# Patient Record
Sex: Female | Born: 1985 | Race: Black or African American | Hispanic: No | Marital: Single | Smoking: Never smoker
Health system: Southern US, Community
[De-identification: ages and names within clinical notes are randomized; demographics above are authoritative.]

## PROBLEM LIST (undated history)

## (undated) ENCOUNTER — Inpatient Hospital Stay (HOSPITAL_COMMUNITY): Payer: Self-pay

## (undated) DIAGNOSIS — R87629 Unspecified abnormal cytological findings in specimens from vagina: Secondary | ICD-10-CM

## (undated) DIAGNOSIS — R569 Unspecified convulsions: Secondary | ICD-10-CM

## (undated) HISTORY — DX: Unspecified abnormal cytological findings in specimens from vagina: R87.629

## (undated) HISTORY — DX: Unspecified convulsions: R56.9

## (undated) HISTORY — PX: NO PAST SURGERIES: SHX2092

---
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
## 2007-01-11 ENCOUNTER — Emergency Department (HOSPITAL_COMMUNITY): Admission: EM | Admit: 2007-01-11 | Discharge: 2007-01-11 | Payer: Self-pay | Admitting: Emergency Medicine

## 2009-09-08 ENCOUNTER — Other Ambulatory Visit: Admission: RE | Admit: 2009-09-08 | Discharge: 2009-09-08 | Payer: Self-pay | Admitting: Family Medicine

## 2009-11-20 ENCOUNTER — Emergency Department (HOSPITAL_COMMUNITY): Admission: EM | Admit: 2009-11-20 | Discharge: 2009-11-20 | Payer: Self-pay | Admitting: Family Medicine

## 2010-05-03 LAB — POCT RAPID STREP A (OFFICE): Streptococcus, Group A Screen (Direct): NEGATIVE

## 2010-05-19 ENCOUNTER — Other Ambulatory Visit (HOSPITAL_COMMUNITY)
Admission: RE | Admit: 2010-05-19 | Discharge: 2010-05-19 | Disposition: A | Discharge status: Home / Self Care | Payer: Managed Care, Other (non HMO) | Source: Ambulatory Visit | Attending: Obstetrics and Gynecology | Admitting: Obstetrics and Gynecology

## 2010-05-19 DIAGNOSIS — Z113 Encounter for screening for infections with a predominantly sexual mode of transmission: Secondary | ICD-10-CM | POA: Insufficient documentation

## 2010-05-19 DIAGNOSIS — Z01419 Encounter for gynecological examination (general) (routine) without abnormal findings: Secondary | ICD-10-CM | POA: Insufficient documentation

## 2010-11-28 LAB — URINALYSIS, ROUTINE W REFLEX MICROSCOPIC
Glucose, UA: NEGATIVE
Hgb urine dipstick: NEGATIVE
Ketones, ur: 15 — AB
Nitrite: NEGATIVE
Protein, ur: 30 — AB
Specific Gravity, Urine: 1.027
Urobilinogen, UA: 1
pH: 7

## 2010-11-28 LAB — WET PREP, GENITAL: Trich, Wet Prep: NONE SEEN

## 2010-11-28 LAB — URINE MICROSCOPIC-ADD ON

## 2010-11-28 LAB — POCT PREGNANCY, URINE
Operator id: 272551
Preg Test, Ur: NEGATIVE

## 2010-11-28 LAB — RAPID STREP SCREEN (MED CTR MEBANE ONLY): Streptococcus, Group A Screen (Direct): POSITIVE — AB

## 2011-12-24 ENCOUNTER — Other Ambulatory Visit (HOSPITAL_COMMUNITY)
Admission: RE | Admit: 2011-12-24 | Discharge: 2011-12-24 | Disposition: A | Discharge status: Home / Self Care | Payer: Managed Care, Other (non HMO) | Source: Ambulatory Visit | Attending: Obstetrics and Gynecology | Admitting: Obstetrics and Gynecology

## 2011-12-24 DIAGNOSIS — Z113 Encounter for screening for infections with a predominantly sexual mode of transmission: Secondary | ICD-10-CM | POA: Insufficient documentation

## 2011-12-24 DIAGNOSIS — Z01419 Encounter for gynecological examination (general) (routine) without abnormal findings: Secondary | ICD-10-CM | POA: Insufficient documentation

## 2011-12-24 DIAGNOSIS — Z1151 Encounter for screening for human papillomavirus (HPV): Secondary | ICD-10-CM | POA: Insufficient documentation

## 2013-07-28 ENCOUNTER — Other Ambulatory Visit: Payer: Self-pay | Admitting: Pediatrics

## 2013-11-12 ENCOUNTER — Encounter: Payer: Self-pay | Admitting: *Deleted

## 2013-11-12 DIAGNOSIS — G40309 Generalized idiopathic epilepsy and epileptic syndromes, not intractable, without status epilepticus: Secondary | ICD-10-CM

## 2013-11-13 ENCOUNTER — Ambulatory Visit (INDEPENDENT_AMBULATORY_CARE_PROVIDER_SITE_OTHER): Payer: Managed Care, Other (non HMO) | Admitting: Family

## 2013-11-13 ENCOUNTER — Encounter: Payer: Self-pay | Admitting: Family

## 2013-11-13 VITALS — BP 114/70 | HR 76 | Ht 68.0 in | Wt 171.8 lb

## 2013-11-13 DIAGNOSIS — Z79899 Other long term (current) drug therapy: Secondary | ICD-10-CM | POA: Insufficient documentation

## 2013-11-13 DIAGNOSIS — G40309 Generalized idiopathic epilepsy and epileptic syndromes, not intractable, without status epilepticus: Secondary | ICD-10-CM

## 2013-11-13 MED ORDER — LAMICTAL 100 MG PO TABS: ORAL_TABLET | ORAL | Status: DC

## 2013-11-13 MED ORDER — LAMOTRIGINE 100 MG PO TABS: ORAL_TABLET | ORAL | Status: DC

## 2013-11-13 NOTE — Patient Instructions (Signed)
I have been you a blood test order to have your Lamictal level checked. I will call you when I have received the results. If I haven't called you within a week of you having the blood test, please call me.   I have sent in a refill to your pharmacy.   For your dizziness, work on increasing your water intake. That may be the reason you are having some of the dizziness. You should be drinking at least 60 oz of water per day.   To help you transition to adult neurology care, I will refer you to Dr Ellouise Newer with [facility]. She is a neurologist that specializes in Epilepsy. I will take care of all refills and any problems you may have until you see her. You will not need to see Dr Delice Lesch until your next annual visit next September.   Please call me if you have any questions or concerns.

## 2013-11-13 NOTE — Progress Notes (Signed)
Patient: Wendy Livingston MRN: [id number] Sex: female DOB: 1985-04-05  Provider: Rockwell Germany, NP Location of Care: [facility]  Note type: Routine return visit  History of Present Illness: Referral Source:  History from: patient Chief Complaint: Seizures  Wendy Livingston is a 28 y.o. with history of  generalized tonic-clonic seizures. She was last seen May 11, 2011. Her last seizure occurred in June 2003. She has been taking and tolerating Lamictal.  There are no plans to taper her off medication because she needs to be able to drive. She says that she is generally compliant but occasionally forgets a dose of Lamictal.   Today says that has occasional tension headaches that are not severe. She recently had a more severe headache with dizziness and ended up leaving work. It is not clear if the headache began first or the dizziness. She said that she was sitting at work and her vision became blurry, then she noted that she was dizzy. She waited, but did not feel better. She remembers having a headache but doesn't recall if it was present first. She said that she also felt funny. She said that the headache lasted for 2 hours, only improving after she went home and took a short nap. 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   Wendy Livingston also complains of some dizziness when moving from a seated to a standing position at work. She admits that she does not drink much water during the day.   Wendy Livingston has been otherwise generally healthy since last seen.   Review of Systems: 12 system review was remarkable for headaches and dizziness  Past Medical History  Diagnosis Date  . Seizures    Hospitalizations: No., Head Injury: No., Nervous System Infections: No., Immunizations up to date: Yes.   Past Medical History Comments: Her last EEG was in 2003. It was abnormal showing generalized spike wave discharges in the 3 per second rage,  consistent with generalized epilepsy. She had a brief 2 1/2 second electrographic seizure during the recording. Intermittent theta slowing was seen emanating from the left posterior temporal region.  Wendy Livingston but had side effects of changes in her ability to concentrate, problems with memory, dry mouth, problems sleeping. She was changed to brand Lamictal and the side effects resolved.   Surgical History History reviewed. No pertinent past surgical history.  Family History family history includes Diabetes in her maternal grandfather and mother; Heart attack in her maternal grandmother; Kidney failure in her maternal grandfather; Seizures in her other; Stroke in her paternal grandfather. Family History is otherwise negative for migraines, seizures, cognitive impairment, blindness, deafness, birth defects, chromosomal disorder, autism.  Social History History   Social History  . Marital Status: Single    Spouse Name: N/A    Number of Children: N/A  . Years of Education: N/A   Social History Main Topics  . Smoking status: Former Research scientist (life sciences)  . Smokeless tobacco: Never Used  . Alcohol Use: Yes  . Drug Use: No  . Sexual Activity: Yes   Other Topics Concern  . None   Social History Narrative  . None   Educational level: Research scientist (physical sciences) Attending:N/A Living with:  both parents  Hobbies/Interest: movies, going out and spending time with friends and family School comments:  Jahanna graduated from [employer] in 2007. She attended [employer] for two years. Wendy Livingston is currently working for [employer].  Physical Exam  BP 114/70  Pulse 76  Ht 5\' 8"  (1.727 m)  Wt 171 lb 12.8 oz (77.928 kg)  BMI 26.13 kg/m2  LMP 11/11/2013 General: well developed, well nourished young woman, seated on exam table, in no evident distress Head: head  normocephalic and atraumatic. Ears, Nose and Throat:  Oropharynx benign. Neck: supple with no carotid or supraclavicular  bruits. Respiratory: lungs clear to auscultation Cardiovascular: regular rate and rhythm, no murmurs Musculoskeletal: no deformities or scoliosis Skin: no rashes or lesions  Neurologic Exam  Mental Status: Awake and fully alert.  Oriented to place and time.  Recent and remote memory intact.  Attention span, concentration, and fund of knowledge appropriate.  Mood and affect appropriate. Cranial Nerves: Fundoscopic exam reveals sharp disc margins.  Pupils equal, briskly reactive to light.  Extraocular movements full without nystagmus.  Visual fields full to confrontation.  Hearing intact and symmetric to finger rub.  Facial sensation intact.  Face, tongue, palate move normally and symmetrically.  Neck flexion and extension normal. Motor: Normal bulk and tone.  Normal strength in all tested extremity muscles. Sensory: Intact to touch and temperature in all extremities. Coordination: Rapid alternating movements normal in all extremities.  Finger-to-nose and heel-to-shin performed accurately bilaterally. Romberg negative. Gait and Station: Arises from chair without difficulty.  Stance is normal.  Gait demonstrates normal stride length and balance.  Able to heel, toe, and tandem walk without difficulty. Reflexes: 1+ and symmetric.  Toes downgoing.  Assessment and Plan Jeremie is a 28 year old young woman with history of generalized tonic-clonic seizures. Her last seizure occurred in June 2003. She remains on medication because she needs to be able to drive. She is taking and tolerating Lamictal. She also has tension headaches, and had a recent headache that was more severe, requiring sleep to resolve. She had blurry vision, then dizziness with it, which makes me think that she had a migraine, but she also said that she felt funny. Because there is some concern for a seizure aura, I recommended that she have her Lamictal level checked. She has not taken her medication yet today, so I gave her a blood test  order to have this done. I will call her when I receive the results. She will continue her medication without change for now. I also talked with her about drinking more water as she also complained of episodes of orthostatic dizziness, in which she would stand up and feel dizziness. She admits that she does not drink much water during the day.   Finally, as Nhyira is 28 years old, I talked with her about transitioning to an adult practice. I will refer her to Dr Ellouise Newer for her follow up visit next year. I will provide refills and manage concerns that she has until she is seen by Dr Delice Lesch. Demica agreed with these plans.

## 2013-12-18 ENCOUNTER — Ambulatory Visit: Payer: Managed Care, Other (non HMO) | Admitting: Neurology

## 2013-12-21 ENCOUNTER — Telehealth: Payer: Self-pay | Admitting: Neurology

## 2013-12-21 ENCOUNTER — Encounter: Payer: Self-pay | Admitting: Neurology

## 2013-12-21 NOTE — Telephone Encounter (Signed)
Pt no showed 12/18/13 NP appt w/ Dr. Delice Lesch. Referring provider office notified via EPIC referral. No show letter mailed to pt / Sherri S.

## 2014-08-16 ENCOUNTER — Encounter: Payer: Self-pay | Admitting: Podiatry

## 2014-08-16 ENCOUNTER — Ambulatory Visit (INDEPENDENT_AMBULATORY_CARE_PROVIDER_SITE_OTHER): Payer: BLUE CROSS/BLUE SHIELD

## 2014-08-16 ENCOUNTER — Ambulatory Visit (INDEPENDENT_AMBULATORY_CARE_PROVIDER_SITE_OTHER): Payer: BLUE CROSS/BLUE SHIELD | Admitting: Podiatry

## 2014-08-16 VITALS — BP 123/75 | HR 59 | Resp 15

## 2014-08-16 DIAGNOSIS — M79673 Pain in unspecified foot: Secondary | ICD-10-CM

## 2014-08-16 DIAGNOSIS — M2040 Other hammer toe(s) (acquired), unspecified foot: Secondary | ICD-10-CM | POA: Diagnosis not present

## 2014-08-16 NOTE — Patient Instructions (Signed)
Pre-Operative Instructions  Congratulations, you have decided to take an important step to improving your quality of life.  You can be assured that the doctors of [facility] will be with you every step of the way.  1. Plan to be at the surgery center/hospital at least 1 (one) hour prior to your scheduled time unless otherwise directed by the surgical center/hospital staff.  You must have a responsible adult accompany you, remain during the surgery and drive you home.  Make sure you have directions to the surgical center/hospital and know how to get there on time. 2. For hospital based surgery you will need to obtain a history and physical form from your family physician within 1 month prior to the date of surgery- we will give you a form for you primary physician.  3. We make every effort to accommodate the date you request for surgery.  There are however, times where surgery dates or times have to be moved.  We will contact you as soon as possible if a change in schedule is required.   4. No Aspirin/Ibuprofen for one week before surgery.  If you are on aspirin, any non-steroidal anti-inflammatory medications (Mobic, Aleve, Ibuprofen) you should stop taking it 7 days prior to your surgery.  You make take Tylenol  For pain prior to surgery.  5. Medications- If you are taking daily heart and blood pressure medications, seizure, reflux, allergy, asthma, anxiety, pain or diabetes medications, make sure the surgery center/hospital is aware before the day of surgery so they may notify you which medications to take or avoid the day of surgery. 6. No food or drink after midnight the night before surgery unless directed otherwise by surgical center/hospital staff. 7. No alcoholic beverages 24 hours prior to surgery.  No smoking 24 hours prior to or 24 hours after surgery. 8. Wear loose pants or shorts- loose enough to fit over bandages, boots, and casts. 9. No slip on shoes, sneakers are best. 10. Bring  your boot with you to the surgery center/hospital.  Also bring crutches or a walker if your physician has prescribed it for you.  If you do not have this equipment, it will be provided for you after surgery. 11. If you have not been contracted by the surgery center/hospital by the day before your surgery, call to confirm the date and time of your surgery. 12. Leave-time from work may vary depending on the type of surgery you have.  Appropriate arrangements should be made prior to surgery with your employer. 13. Prescriptions will be provided immediately following surgery by your doctor.  Have these filled as soon as possible after surgery and take the medication as directed. 14. Remove nail polish on the operative foot. 15. Wash the night before surgery.  The night before surgery wash the foot and leg well with the antibacterial soap provided and water paying special attention to beneath the toenails and in between the toes.  Rinse thoroughly with water and dry well with a towel.  Perform this wash unless told not to do so by your physician.  Enclosed: 1 Ice pack (please put in freezer the night before surgery)   1 Hibiclens skin cleaner   Pre-op Instructions  If you have any questions regarding the instructions, do not hesitate to call our office.  [place]: [address] [place], [place] [address] [phone]  [place]: [address] [place], [place] [address] [phone]  [place]: [address]  [place],  [address] [phone]  Dr. Richard   Tuchman DPM, Dr. Norman Regal DPM Dr. Richard Sikora DPM, Dr. M. Todd Hyatt DPM, Dr. Kathryn Egerton DPM 

## 2014-08-16 NOTE — Progress Notes (Signed)
Subjective:     Patient ID: Wendy Livingston, female   DOB: June 16, 1985, 29 y.o.   MRN: [id number]  HPI patient states I've had painful corns on my second toes of both feet and long-term problems with the fifth toes of both feet and states that it's been ongoing and she's tried to trim them pad them and using medicated corn pads without relief and has tried wider shoes area that corns on the fifth toes of been present for years and second toes more recently Review of Systems  All other systems reviewed and are negative.      Objective:   Physical Exam  Constitutional: She is oriented to person, place, and time.  Cardiovascular: Intact distal pulses.   Musculoskeletal: Normal range of motion.  Neurological: She is oriented to person, place, and time.  Skin: Skin is warm.  Nursing note and vitals reviewed.  neurovascular status intact with muscle strength adequate range of motion within normal limits. Patient's noted to have good digital perfusion is well oriented 3 and is found to have rotation of the fifth digit bilateral with keratotic lesion on the head of the proximal phalanx and distal keratotic lesion digits to bilateral that are painful when pressed     Assessment:     Hammertoe deformity of the second and fifth toes of both feet with lesion formation    Plan:     H&P and condition reviewed with patient. At this point I discussed conservative and surgical treatments and she states she's tried numerous conservative treatments and she would prefer to have them fixed. She would like to do a consent form today so she does not have another co-pay and at this time I did allow her to read a consent form for arthroplasty digit 5 bilateral and distal digit 2 bilateral reviewing alternative treatments and complications. Patient wants procedure understanding risk and signs consent form and understands total recovery. We'll take 6 months to one year. Patient is scheduled outpatient surgery in the  next few weeks and is given all preoperative instructions and is encouraged to call with any questions

## 2014-08-25 ENCOUNTER — Other Ambulatory Visit: Payer: Self-pay | Admitting: Obstetrics and Gynecology

## 2014-08-25 ENCOUNTER — Other Ambulatory Visit (HOSPITAL_COMMUNITY)
Admission: RE | Admit: 2014-08-25 | Discharge: 2014-08-25 | Disposition: A | Discharge status: Home / Self Care | Payer: BLUE CROSS/BLUE SHIELD | Source: Ambulatory Visit | Attending: Obstetrics and Gynecology | Admitting: Obstetrics and Gynecology

## 2014-08-25 DIAGNOSIS — Z01411 Encounter for gynecological examination (general) (routine) with abnormal findings: Secondary | ICD-10-CM | POA: Diagnosis not present

## 2014-08-25 DIAGNOSIS — R87810 Cervical high risk human papillomavirus (HPV) DNA test positive: Secondary | ICD-10-CM | POA: Diagnosis present

## 2014-08-25 DIAGNOSIS — Z1151 Encounter for screening for human papillomavirus (HPV): Secondary | ICD-10-CM | POA: Insufficient documentation

## 2014-08-26 LAB — CYTOLOGY - PAP

## 2014-08-30 ENCOUNTER — Telehealth: Payer: Self-pay | Admitting: Family

## 2014-08-30 DIAGNOSIS — G40309 Generalized idiopathic epilepsy and epileptic syndromes, not intractable, without status epilepticus: Secondary | ICD-10-CM

## 2014-08-30 DIAGNOSIS — Z79899 Other long term (current) drug therapy: Secondary | ICD-10-CM

## 2014-08-30 NOTE — Telephone Encounter (Signed)
I reviewed your note and agree with this plan, thank you. 

## 2014-08-30 NOTE — Telephone Encounter (Signed)
Wendy Livingston left a message saying that an oral contraceptive pill had been prescribed for her, and she wants to know if her Lamictal dose should be adjusted. I called and talked with Wendy Livingston about her question. I explained that oral contraceptives such as Wendy Livingston can increase the clearance of Lamictal and thus create a lower and possible non-therapeutic drug level. I explained to her that since she hasn't started the [facility] yet, that we should draw a trough blood level prior to starting it, then draw it again about 7-10 days after being on the medication. Wendy Livingston agreed with this plan and will call back to let me know where she wants to go for blood draw. I will send the order after I hear from her again. Wendy Livingston can be reached at [phone]. TG

## 2014-08-30 NOTE — Telephone Encounter (Signed)
Wendy Livingston called back and said that she wants the blood test order sent to her OB/GYN office. Fax order to [phone], attention, Sharyn Lull.

## 2014-08-30 NOTE — Addendum Note (Signed)
Addended by: Joelyn Oms on: 08/30/2014 05:08 PM   Modules accepted: Orders

## 2014-09-05 LAB — LAMOTRIGINE LEVEL: LAMOTRIGINE LVL: 3.5 ug/mL — AB (ref 4.0–18.0)

## 2014-09-08 ENCOUNTER — Telehealth: Payer: Self-pay | Admitting: *Deleted

## 2014-09-08 NOTE — Telephone Encounter (Signed)
Pt states she would like to change her 09/21/2014 surgery to 12/14/2014.  Done.

## 2014-09-27 ENCOUNTER — Other Ambulatory Visit: Payer: Self-pay | Admitting: Obstetrics and Gynecology

## 2014-10-27 ENCOUNTER — Encounter: Payer: Managed Care, Other (non HMO) | Admitting: Obstetrics & Gynecology

## 2014-10-29 ENCOUNTER — Ambulatory Visit (INDEPENDENT_AMBULATORY_CARE_PROVIDER_SITE_OTHER): Payer: BLUE CROSS/BLUE SHIELD | Admitting: Obstetrics & Gynecology

## 2014-10-29 ENCOUNTER — Encounter: Payer: Self-pay | Admitting: Obstetrics & Gynecology

## 2014-10-29 VITALS — BP 139/85 | HR 93 | Temp 97.7°F | Resp 20 | Ht 68.0 in | Wt 174.5 lb

## 2014-10-29 DIAGNOSIS — N898 Other specified noninflammatory disorders of vagina: Secondary | ICD-10-CM | POA: Diagnosis not present

## 2014-10-29 NOTE — Progress Notes (Signed)
Pt states she is here for a second opinion. Her Ob/Gyn has recommended LEEP procedure but did not discuss/offer other options.  She also is having some intermittent vaginal itching and would like to be checked for BV/Yeast.

## 2014-10-29 NOTE — Patient Instructions (Signed)
Cryoablation Cryoablation is used to remove abnormal or cancerous tissue by freezing. A probe cooled with liquid nitrogen is directed beneath the skin to the growth. The growth is frozen and destroyed. During cryoablation, liquid nitrogen or argon gas flows into a needlelike applicator (cryoprobe), creating intense cold that is placed in contact with the diseased tissue. Imaging techniques such as an ultrasound, CT scans, or an MRI may be used to help guide the cryoprobes to the proper location inside the body. LET YOUR HEALTH CARE PROVIDER KNOW ABOUT:   Any allergies you have.   All medicines you are taking, including vitamins, herbs, eye drops, creams, and over-the-counter medicines.  Previous problems you or members of your family have had with the use of anesthetics.  Any blood disorders you have.  Previous surgeries you have had.  Medical conditions you have.  RISKS AND COMPLICATIONS  Generally, this is a safe procedure. However, as with any procedure, complications can occur. Possible complications include:   Infection.  Bleeding.  Tumor is not completely destroyed or the tumor comes back (recurrence). BEFORE THE PROCEDURE   You may need to have blood tests. These tests can help tell how well your kidneys and liver are working. They can also show how well your blood clots.   If you take blood thinners, ask your health care provider when you should stop taking them.   Make arrangements for someone to drive you home. Depending on the procedure, you may be able to go home the same day. However, most people stay overnight in the hospital after this procedure. Ask your health care provider what to expect.  PROCEDURE  This procedure usually takes about 1 to 3 hours.  You will lie on an exam table. You will be connected to monitors that keep track of your heart rate, blood pressure, and breathing throughout the procedure.  An IV access tube will be placed in one of your  veins. You may be given any of the following:  A medicine that makes you sleep through the procedure (general anesthetic).  A medicine to relax you (sedative).  A medicine to numb the area (local anesthetic).   For diseased tissue located deep in the body, your health care provider will use image guidance to insert one or more cryoprobes through the skin to the site of the diseased tissue. The liquid nitrogen or argon gas will then be delivered.  To cause cell death of the diseased tissue, the tissue is repeatedly frozen and thawed. Typically, two freeze-thaw cycles are used. Once the cells are destroyed, the white blood cells of the immune system will work to clear out the dead tissue. AFTER THE PROCEDURE   If you were given a sedative, you will be sleepy.  If you were put under general anesthesia, your throat may be sore after you wake up. This is caused by the breathing tube that was placed in your throat while you were asleep.  You may be allowed to go home when you are awake and able to drink fluids, or you may need to stay in the hospital overnight.  The puncture site will be sore. This is normal. The soreness will go away in 3-5 days. Some minor bruising may also be present.  You may have some mild abdominal, flank, or right shoulder pain for 24 hours. Document Released: 11/26/2012 Document Reviewed: 11/26/2012 Exit[employer] Patient Information 2015 [employer]. This information is not intended to replace advice given to you by your health care provider.   Make sure you discuss any questions you have with your health care provider. Loop Electrosurgical Excision Procedure Loop electrosurgical excision procedure (LEEP) is the removal of a portion of the lower part of the uterus (cervix). The procedure is done when there are significantly abnormal cervical cell changes. Abnormal cell changes of the cervix can lead to cancer if left in place and untreated.  The LEEP procedure itself  typically only takes a few minutes. Often, it may be done in your caregiver's office. The procedure is considered safe for those who wish to get pregnant or are trying to get pregnant. Only under rare circumstances should this procedure be done if you are pregnant. LET YOUR CAREGIVER KNOW ABOUT:  Whether you are pregnant or late for your last menstrual period.  Allergies to foods or medicines.  All the medicines you are taking includingherbs, eyedrops, and over-the-counter medicines, and creams.  Use of steroids (by mouth or creams).  Previous problems with anesthetics or numbing medicine.  Previous gynecological surgery.  History of blood clots or bleeding problems.  Any recent or current vaginal infections (herpes, sexually transmitted infections).  Other health problems. RISKS AND COMPLICATIONS  Bleeding.  Infection.  Injury to the vagina, bladder, or rectum.  Very rare obstruction of the cervical opening that causes problems during menstruation (cervical stenosis). BEFORE THE PROCEDURE  Do not take aspirin or blood thinners (anticoagulants) for 1 week before the procedure, or as told by your caregiver.  Eat a light meal before the procedure.  Ask your caregiver about changing or stopping your regular medicines.  You may be given a pain reliever 1 or 2 hours before the procedure. PROCEDURE   A tool (speculum) is placed in the vagina. This allows your caregiver to see the cervix.  An iodine stain is applied to the cervix to find the area of abnormal cells to be removed.  Medicine is injected to numb the cervix (local anesthetic).   Electricity is passed through a thin wire loop which is then used to remove (cauterize) a small segment of the affected cervix.  Light electrocautery is used to seal any small blood vessels and prevent bleeding.  A paste may be applied to the cauterized area of the cervix to help prevent bleeding.  The tissue sample is sent to the  lab. It is examined under the microscope. AFTER THE PROCEDURE  Have someone drive you home.  You may have slight to moderate cramping.  You may notice a black vaginal discharge from the paste used on the cervix to prevent bleeding. This is normal.  Watch for excessive bleeding. This requires immediate medical care.  Ask when your test results will be ready. Make sure you get your test results. Document Released: 04/28/2002 Document Revised: 04/30/2011 Document Reviewed: 07/18/2010 [facility] Patient Information 2015 [place], [place]. This information is not intended to replace advice given to you by your health care provider. Make sure you discuss any questions you have with your health care provider.

## 2014-10-29 NOTE — Progress Notes (Signed)
Patient ID: Wendy Livingston, female   DOB: 19-Oct-1985, 29 y.o.   MRN: [id number] History:  29 y.o. G0P0000 here today for 2nd opinion of management of abnormal PAP.   She reports tha tshe was told that she would need a LEEP.  She has questions about cryo.  She also c/o vaginal itching that has been present intermittently for <1 week  The following portions of the patient's history were reviewed and updated as appropriate: allergies, current medications, past family history, past medical history, past social history, past surgical history and problem list.  Review of Systems:  Pertinent items are noted in HPI.  Objective:  Physical Exam Blood pressure 139/85, pulse 93, temperature 97.7 F (36.5 C), temperature source Oral, resp. rate 20, height 5\' 8"  (1.727 m), weight 174 lb 8 oz (79.153 kg), last menstrual period 10/04/2014. Gen: NAD Abd: Soft, nontender and nondistended Pelvic: Normal appearing external genitalia; normal appearing vaginal mucosa and cervix.  Normal discharge.    Labs and Imaging surg path 09/27/2014 [facility] 11 o'clock: CIN I (Low grade lesion; mild dysplasia) 8 o'clock: low grade dysplasia 6 o'clock:  High grade lesion ECC: high grade dysplasia  Assessment & Plan:  Cervical bx review: I explained to pt the significance of an abnormal PAP and colpo as a screening test for cervical ca and explained with cervical dysplasia is.  I have reviewed with pt that with high grade dysplasia an excisional biopsy is recommended.  She also has a +ECC.   I have also explained with her that because I did not do the actual colposcopy and I ONLY have the path reports from her to review other considerations may have been accounted for.  All of her questions were answered.  Pt offered directed LEEP with removal of entire ECC   Discharge/leukorrhea: KOH and wet mount sent  84min of 22min visit spent in discussion   Nyaja Dubuque L. Harraway-Smith, M.D., Cherlynn June

## 2014-10-30 LAB — WET PREP, GENITAL
Clue Cells Wet Prep HPF POC: NONE SEEN
TRICH WET PREP: NONE SEEN
YEAST WET PREP: NONE SEEN

## 2014-12-13 ENCOUNTER — Telehealth: Payer: Self-pay | Admitting: *Deleted

## 2014-12-13 NOTE — Telephone Encounter (Signed)
I'm calling from the Dr. Mellody Drown office.  "So you got my message.  I'm going to have to hold off on my surgery."  Is there a particular reason why?  "Anesthesia told me I would have a lot of money up front and I can't afford that right now.  I'll have to make arrangements for that."  Okay, I'll let Dr. Paulla Dolly know and call the surgical center.  I called and canceled surgery at the surgical center.

## 2014-12-14 ENCOUNTER — Encounter: Payer: Self-pay | Admitting: *Deleted

## 2014-12-14 NOTE — Telephone Encounter (Signed)
Please reschedule her in the office. We can give her oral valium and someone should drive her in for surgery

## 2014-12-21 ENCOUNTER — Other Ambulatory Visit: Payer: Self-pay

## 2014-12-22 ENCOUNTER — Telehealth: Payer: Self-pay | Admitting: *Deleted

## 2014-12-22 NOTE — Telephone Encounter (Signed)
I left messages for the patient to call me back.  Dr. Paulla Dolly wants to offer doing surgery in office since cost was too high for anesthesia at [facility].

## 2014-12-24 ENCOUNTER — Other Ambulatory Visit: Payer: Self-pay

## 2014-12-30 NOTE — Telephone Encounter (Signed)
"  I'm returning your call."  Yes, I was calling on behalf of Dr. Paulla Dolly.  He stated since you are having problems with cost for anesthesia at surgical center, he can do your surgery here in the office.  "I would like that.  Let me call you back to set up a date."

## 2014-12-30 NOTE — Telephone Encounter (Signed)
I left her a message to give me a call back regarding surgery.

## 2015-02-04 ENCOUNTER — Other Ambulatory Visit: Payer: Self-pay | Admitting: Family

## 2015-02-22 ENCOUNTER — Ambulatory Visit: Payer: BLUE CROSS/BLUE SHIELD | Admitting: Family

## 2015-06-02 DIAGNOSIS — F4321 Adjustment disorder with depressed mood: Secondary | ICD-10-CM | POA: Diagnosis not present

## 2015-06-16 ENCOUNTER — Other Ambulatory Visit: Payer: Self-pay | Admitting: Obstetrics and Gynecology

## 2015-06-16 ENCOUNTER — Other Ambulatory Visit (HOSPITAL_COMMUNITY)
Admission: RE | Admit: 2015-06-16 | Discharge: 2015-06-16 | Disposition: A | Discharge status: Home / Self Care | Payer: BLUE CROSS/BLUE SHIELD | Source: Ambulatory Visit | Attending: Obstetrics and Gynecology | Admitting: Obstetrics and Gynecology

## 2015-06-16 DIAGNOSIS — Z01411 Encounter for gynecological examination (general) (routine) with abnormal findings: Secondary | ICD-10-CM | POA: Insufficient documentation

## 2015-06-16 DIAGNOSIS — N888 Other specified noninflammatory disorders of cervix uteri: Secondary | ICD-10-CM | POA: Diagnosis not present

## 2015-06-16 DIAGNOSIS — Z1151 Encounter for screening for human papillomavirus (HPV): Secondary | ICD-10-CM | POA: Diagnosis not present

## 2015-06-16 DIAGNOSIS — R87612 Low grade squamous intraepithelial lesion on cytologic smear of cervix (LGSIL): Secondary | ICD-10-CM | POA: Diagnosis not present

## 2015-06-16 DIAGNOSIS — N871 Moderate cervical dysplasia: Secondary | ICD-10-CM | POA: Diagnosis not present

## 2015-06-20 LAB — CYTOLOGY - PAP

## 2015-07-07 DIAGNOSIS — F3342 Major depressive disorder, recurrent, in full remission: Secondary | ICD-10-CM | POA: Diagnosis not present

## 2015-08-18 DIAGNOSIS — F410 Panic disorder [episodic paroxysmal anxiety] without agoraphobia: Secondary | ICD-10-CM | POA: Diagnosis not present

## 2015-08-18 DIAGNOSIS — F3342 Major depressive disorder, recurrent, in full remission: Secondary | ICD-10-CM | POA: Diagnosis not present

## 2015-08-24 ENCOUNTER — Ambulatory Visit (INDEPENDENT_AMBULATORY_CARE_PROVIDER_SITE_OTHER): Payer: BLUE CROSS/BLUE SHIELD | Admitting: Family

## 2015-08-24 ENCOUNTER — Encounter: Payer: Self-pay | Admitting: Family

## 2015-08-24 VITALS — BP 110/70 | HR 82 | Ht 68.0 in | Wt 174.2 lb

## 2015-08-24 DIAGNOSIS — G43009 Migraine without aura, not intractable, without status migrainosus: Secondary | ICD-10-CM

## 2015-08-24 DIAGNOSIS — G44219 Episodic tension-type headache, not intractable: Secondary | ICD-10-CM | POA: Diagnosis not present

## 2015-08-24 DIAGNOSIS — G40309 Generalized idiopathic epilepsy and epileptic syndromes, not intractable, without status epilepticus: Secondary | ICD-10-CM

## 2015-08-24 NOTE — Patient Instructions (Signed)
Continue taking Lamictal as you have been taking it. Let me know if you have any seizures.   Keep track of your headaches. If they become more frequent or severe, we may need to consider a migraine preventative medication. Let me know if you are having 1 severe headache per week.   Please plan to return for follow up in 1 year or sooner if needed.

## 2015-08-24 NOTE — Progress Notes (Signed)
Patient: Wendy Livingston MRN: [id number] Sex: female DOB: [birth year]  Provider: Rockwell Germany, NP Location of Care: [facility] Child Neurology  Note type: Routine return visit  History of Present Illness: Referral Source: Dr. Alessandra Grout History from: patient and [facility] chart Chief Complaint: Seizures  Wendy Livingston is a 30 y.o. woman with history of generalized tonic-clonic seizures. She was last seen November 13, 2013 and came in at my request in order for refills to be authorized. Her last seizure occurred in June 2003. She has been taking and tolerating Lamictal. There are no plans to taper her off medication because she needs to be able to drive. She says that she is generally compliant but occasionally forgets a dose of Lamictal.   [name] says that has occasional tension headaches that are not severe. She occasionally has a more severe headache that requires sleep to obtain relief. With these headaches, she complains of frontal or bitemporal pain. She recently had a headache in which she seemed to be aware of a odor "like a locker room" that preceeded the headache. She estimates that lasted about an hour. When [name] has a headache, she takes generic Excedrin Migraine and sometimes has to lie down in order for the headache to resolve. She is unsure how often these headaches occur but estimates once or twice per month.   [name] admits that she does not drink much water and that she occasionally skips meals. She says that she works from 12n-9pm and tends to go to bed late and sleep late.   [name] has been otherwise generally healthy since last seen and has no other health concerns today other than previously mentioned.  Review of Systems: Please see the HPI for neurologic and other pertinent review of systems. Otherwise, the following systems are noncontributory including constitutional, eyes, ears, nose and throat, cardiovascular, respiratory, gastrointestinal, genitourinary,  musculoskeletal, skin, endocrine, hematologic/lymph, allergic/immunologic and psychiatric.   Past Medical History  Diagnosis Date  . Seizures ([place])   . Vaginal Pap smear, abnormal    Hospitalizations: No., Head Injury: No., Nervous System Infections: No., Immunizations up to date: Yes.   Past Medical History Comments: Her last EEG was in 2003. It was abnormal showing generalized spike wave discharges in the 3 per second rage, consistent with generalized epilepsy. She had a brief 2 1/2 second electrographic seizure during the recording. Intermittent theta slowing was seen emanating from the left posterior temporal region.  Surgical History History reviewed. No pertinent past surgical history.  Family History family history includes Diabetes in her maternal grandfather and mother; Heart attack in her maternal grandmother; Kidney failure in her maternal grandfather; Seizures in her other; Stroke in her paternal grandfather. Family History is otherwise negative for migraines, seizures, cognitive impairment, blindness, deafness, birth defects, chromosomal disorder, autism.  Social History Social History   Social History  . Marital Status: Single    Spouse Name: N/A  . Number of Children: N/A  . Years of Education: N/A   Social History Main Topics  . Smoking status: Never Smoker   . Smokeless tobacco: Never Used  . Alcohol Use: 0.0 oz/week    0 Standard drinks or equivalent per week     Comment: socially  . Drug Use: No  . Sexual Activity: Yes    Birth Control/ Protection: Pill   Other Topics Concern  . None   Social History Narrative    Allergies No Known Allergies  Physical Exam BP 110/70 mmHg  Pulse 82  Ht 5\' 8"  (1.727 m)  Wt 174 lb 3.2 oz (79.017 kg)  BMI 26.49 kg/m2  LMP 08/20/2015 General: well developed, well nourished young woman, seated on exam table, in no evident distress Head: head normocephalic and atraumatic. Ears, Nose and Throat: Oropharynx  benign. Neck: supple with no carotid or supraclavicular bruits. Respiratory: lungs clear to auscultation Cardiovascular: regular rate and rhythm, no murmurs Musculoskeletal: no deformities or scoliosis Skin: no rashes or lesions  Neurologic Exam  Mental Status: Awake and fully alert. Oriented to place and time. Recent and remote memory intact. Attention span, concentration, and fund of knowledge appropriate. Mood and affect appropriate. Cranial Nerves: Fundoscopic exam reveals sharp disc margins. Pupils equal, briskly reactive to light. Extraocular movements full without nystagmus. Visual fields full to confrontation. Hearing intact and symmetric to finger rub. Facial sensation intact. Face, tongue, palate move normally and symmetrically. Neck flexion and extension normal. Motor: Normal bulk and tone. Normal strength in all tested extremity muscles. Sensory: Intact to touch and temperature in all extremities. Coordination: Rapid alternating movements normal in all extremities. Finger-to-nose and heel-to-shin performed accurately bilaterally. Romberg negative. Gait and Station: Arises from chair without difficulty. Stance is normal. Gait demonstrates normal stride length and balance. Able to heel, toe, and tandem walk without difficulty. Reflexes: 1+ and symmetric. Toes downgoing.  Impression 1. Generalized convulsive epilepsy, not intractable 2. Episodic tension headaches, not intractable 3. Migraine without aura, not intractable   Recommendations for plan of care The patient's previous [facility] records were reviewed. Velveeta has neither had nor required imaging or lab studies since the last visit. She is a 30 year old young woman with history of generalized tonic-clonic seizures. Her last seizure occurred in June 2003. She is taking and tolerating Lamictal and will remain on medication because she needs to be able to drive. Seraya also reports episodic tension headaches and a more  severe headache that is likely migraine without aura. I talked with her about headaches and migraines, and reviewed typical triggers such as skipped meals, not drinking enough water, not getting enough sleep and stress. I asked her to keep track of her headaches and to let me know if she has at least 1 severe headache per week, as she may be a candidate for preventative medication.   When Lylliana was last seen, I had planned for her to transition to adult neurology care, but that did not occur for reasons that are not clear to me. As Davetta is complaining of more severe headaches, I will follow her for the time being, but if she does well and does not need migraine preventative medication, I will make arrangements at her next visit to transition her care. I will see her back in follow up in 1 year or sooner if needed. Teeya agreed with the plans made today.   The medication list was reviewed and reconciled.  No changes were made in the prescribed medications today.  A complete medication list was provided to the patient.     Medication List       This list is accurate as of: 08/24/15 10:39 AM.  Always use your most recent med list.               diazepam 5 MG tablet  Commonly known as:  VALIUM  Take 5 mg by mouth every 6 (six) hours as needed for anxiety.     LAMICTAL 100 MG tablet  Generic drug:  lamoTRIgine  TAKE 1 TABLET BY MOUTH TWICE A  DAY     OGESTREL 0.5-50 MG-MCG tablet  Generic drug:  norgestrel-ethinyl estradiol  Take 1 tablet by mouth daily. Reported on 08/24/2015        Total time spent with the patient was 20 minutes, of which 50% or more was spent in counseling and coordination of care.   Rockwell Germany

## 2015-08-25 DIAGNOSIS — G43009 Migraine without aura, not intractable, without status migrainosus: Secondary | ICD-10-CM | POA: Insufficient documentation

## 2015-08-25 DIAGNOSIS — G44219 Episodic tension-type headache, not intractable: Secondary | ICD-10-CM | POA: Insufficient documentation

## 2015-09-21 ENCOUNTER — Other Ambulatory Visit: Payer: Self-pay | Admitting: Obstetrics and Gynecology

## 2015-09-21 ENCOUNTER — Other Ambulatory Visit (HOSPITAL_COMMUNITY)
Admission: RE | Admit: 2015-09-21 | Discharge: 2015-09-21 | Disposition: A | Discharge status: Home / Self Care | Payer: BLUE CROSS/BLUE SHIELD | Source: Ambulatory Visit | Attending: Obstetrics and Gynecology | Admitting: Obstetrics and Gynecology

## 2015-09-21 DIAGNOSIS — Z01411 Encounter for gynecological examination (general) (routine) with abnormal findings: Secondary | ICD-10-CM | POA: Diagnosis not present

## 2015-09-21 DIAGNOSIS — Z113 Encounter for screening for infections with a predominantly sexual mode of transmission: Secondary | ICD-10-CM | POA: Insufficient documentation

## 2015-09-21 DIAGNOSIS — Z1151 Encounter for screening for human papillomavirus (HPV): Secondary | ICD-10-CM | POA: Diagnosis not present

## 2015-09-21 DIAGNOSIS — Z01419 Encounter for gynecological examination (general) (routine) without abnormal findings: Secondary | ICD-10-CM | POA: Insufficient documentation

## 2015-09-23 LAB — CYTOLOGY - PAP

## 2015-10-06 ENCOUNTER — Other Ambulatory Visit: Payer: Self-pay | Admitting: Family

## 2015-10-07 NOTE — Telephone Encounter (Signed)
Faxed Rx to pharmacy as requested

## 2015-10-21 DIAGNOSIS — K0510 Chronic gingivitis, plaque induced: Secondary | ICD-10-CM | POA: Diagnosis not present

## 2015-10-21 DIAGNOSIS — K0889 Other specified disorders of teeth and supporting structures: Secondary | ICD-10-CM | POA: Diagnosis not present

## 2016-01-20 DIAGNOSIS — F39 Unspecified mood [affective] disorder: Secondary | ICD-10-CM | POA: Diagnosis not present

## 2016-01-20 DIAGNOSIS — R7309 Other abnormal glucose: Secondary | ICD-10-CM | POA: Diagnosis not present

## 2016-01-20 DIAGNOSIS — R5383 Other fatigue: Secondary | ICD-10-CM | POA: Diagnosis not present

## 2016-01-20 DIAGNOSIS — L989 Disorder of the skin and subcutaneous tissue, unspecified: Secondary | ICD-10-CM | POA: Diagnosis not present

## 2016-02-10 DIAGNOSIS — F411 Generalized anxiety disorder: Secondary | ICD-10-CM | POA: Diagnosis not present

## 2016-02-10 DIAGNOSIS — F3342 Major depressive disorder, recurrent, in full remission: Secondary | ICD-10-CM | POA: Diagnosis not present

## 2016-04-13 DIAGNOSIS — Z136 Encounter for screening for cardiovascular disorders: Secondary | ICD-10-CM | POA: Diagnosis not present

## 2016-04-13 DIAGNOSIS — Z13220 Encounter for screening for lipoid disorders: Secondary | ICD-10-CM | POA: Diagnosis not present

## 2016-04-13 DIAGNOSIS — Z6826 Body mass index (BMI) 26.0-26.9, adult: Secondary | ICD-10-CM | POA: Diagnosis not present

## 2016-04-13 DIAGNOSIS — Z713 Dietary counseling and surveillance: Secondary | ICD-10-CM | POA: Diagnosis not present

## 2016-05-21 DIAGNOSIS — Z6827 Body mass index (BMI) 27.0-27.9, adult: Secondary | ICD-10-CM | POA: Diagnosis not present

## 2016-05-21 DIAGNOSIS — N939 Abnormal uterine and vaginal bleeding, unspecified: Secondary | ICD-10-CM | POA: Diagnosis not present

## 2016-05-21 DIAGNOSIS — Z01419 Encounter for gynecological examination (general) (routine) without abnormal findings: Secondary | ICD-10-CM | POA: Diagnosis not present

## 2016-06-13 DIAGNOSIS — N913 Primary oligomenorrhea: Secondary | ICD-10-CM | POA: Diagnosis not present

## 2016-06-13 DIAGNOSIS — E282 Polycystic ovarian syndrome: Secondary | ICD-10-CM | POA: Diagnosis not present

## 2016-08-03 DIAGNOSIS — F410 Panic disorder [episodic paroxysmal anxiety] without agoraphobia: Secondary | ICD-10-CM | POA: Diagnosis not present

## 2016-08-03 DIAGNOSIS — F331 Major depressive disorder, recurrent, moderate: Secondary | ICD-10-CM | POA: Diagnosis not present

## 2016-08-03 DIAGNOSIS — F4321 Adjustment disorder with depressed mood: Secondary | ICD-10-CM | POA: Diagnosis not present

## 2016-08-30 DIAGNOSIS — F3342 Major depressive disorder, recurrent, in full remission: Secondary | ICD-10-CM | POA: Diagnosis not present

## 2016-10-02 ENCOUNTER — Telehealth: Payer: BLUE CROSS/BLUE SHIELD | Admitting: Family

## 2016-10-02 DIAGNOSIS — N390 Urinary tract infection, site not specified: Secondary | ICD-10-CM

## 2016-10-02 DIAGNOSIS — A499 Bacterial infection, unspecified: Secondary | ICD-10-CM

## 2016-10-02 MED ORDER — NITROFURANTOIN MONOHYD MACRO 100 MG PO CAPS: 100.0000 mg | ORAL_CAPSULE | Freq: Two times a day (BID) | ORAL | 0 refills | Status: DC

## 2016-10-02 NOTE — Progress Notes (Signed)

## 2016-10-08 ENCOUNTER — Other Ambulatory Visit: Payer: Self-pay | Admitting: Pediatrics

## 2016-10-08 NOTE — Telephone Encounter (Signed)
L/M informing patient that it is time for her follow up appointment. One refill sent

## 2016-10-18 DIAGNOSIS — K047 Periapical abscess without sinus: Secondary | ICD-10-CM | POA: Diagnosis not present

## 2016-11-16 ENCOUNTER — Telehealth: Payer: BLUE CROSS/BLUE SHIELD | Admitting: Family

## 2016-11-16 DIAGNOSIS — B3731 Acute candidiasis of vulva and vagina: Secondary | ICD-10-CM

## 2016-11-16 DIAGNOSIS — B373 Candidiasis of vulva and vagina: Secondary | ICD-10-CM

## 2016-11-16 MED ORDER — FLUCONAZOLE 150 MG PO TABS: 150.0000 mg | ORAL_TABLET | ORAL | 0 refills | Status: DC | PRN

## 2016-11-16 NOTE — Progress Notes (Signed)

## 2016-11-21 DIAGNOSIS — B373 Candidiasis of vulva and vagina: Secondary | ICD-10-CM | POA: Diagnosis not present

## 2016-11-21 DIAGNOSIS — N760 Acute vaginitis: Secondary | ICD-10-CM | POA: Diagnosis not present

## 2017-01-16 ENCOUNTER — Other Ambulatory Visit: Payer: Self-pay | Admitting: Pediatrics

## 2017-01-16 NOTE — Telephone Encounter (Signed)
I will refill once with a note that future refills will only occur with a visit. I sent a message to the scheduler to call [facility] to set up an appointment. TG

## 2017-01-16 NOTE — Telephone Encounter (Signed)
Wendy Livingston, this patient has not been seen since July 2017. How would you like to proceed?

## 2017-01-16 NOTE — Telephone Encounter (Signed)
Rx was faxed to the pharacy

## 2017-01-30 DIAGNOSIS — M79645 Pain in left finger(s): Secondary | ICD-10-CM | POA: Diagnosis not present

## 2017-03-14 DIAGNOSIS — F3342 Major depressive disorder, recurrent, in full remission: Secondary | ICD-10-CM | POA: Diagnosis not present

## 2017-03-14 DIAGNOSIS — F410 Panic disorder [episodic paroxysmal anxiety] without agoraphobia: Secondary | ICD-10-CM | POA: Diagnosis not present

## 2017-03-25 DIAGNOSIS — R1013 Epigastric pain: Secondary | ICD-10-CM | POA: Diagnosis not present

## 2017-03-25 DIAGNOSIS — K219 Gastro-esophageal reflux disease without esophagitis: Secondary | ICD-10-CM | POA: Diagnosis not present

## 2017-04-16 DIAGNOSIS — Z713 Dietary counseling and surveillance: Secondary | ICD-10-CM | POA: Diagnosis not present

## 2017-04-16 DIAGNOSIS — Z13220 Encounter for screening for lipoid disorders: Secondary | ICD-10-CM | POA: Diagnosis not present

## 2017-04-16 DIAGNOSIS — Z136 Encounter for screening for cardiovascular disorders: Secondary | ICD-10-CM | POA: Diagnosis not present

## 2017-04-16 DIAGNOSIS — Z6827 Body mass index (BMI) 27.0-27.9, adult: Secondary | ICD-10-CM | POA: Diagnosis not present

## 2017-04-22 DIAGNOSIS — Z113 Encounter for screening for infections with a predominantly sexual mode of transmission: Secondary | ICD-10-CM | POA: Diagnosis not present

## 2017-09-05 DIAGNOSIS — F4321 Adjustment disorder with depressed mood: Secondary | ICD-10-CM | POA: Diagnosis not present

## 2017-09-05 DIAGNOSIS — F331 Major depressive disorder, recurrent, moderate: Secondary | ICD-10-CM | POA: Diagnosis not present

## 2017-09-09 DIAGNOSIS — H04123 Dry eye syndrome of bilateral lacrimal glands: Secondary | ICD-10-CM | POA: Diagnosis not present

## 2017-09-23 ENCOUNTER — Encounter (INDEPENDENT_AMBULATORY_CARE_PROVIDER_SITE_OTHER): Payer: Self-pay | Admitting: Family

## 2017-09-23 ENCOUNTER — Ambulatory Visit (INDEPENDENT_AMBULATORY_CARE_PROVIDER_SITE_OTHER): Payer: BLUE CROSS/BLUE SHIELD | Admitting: Family

## 2017-09-23 VITALS — BP 98/70 | HR 60 | Ht 68.0 in | Wt 173.6 lb

## 2017-09-23 DIAGNOSIS — G40309 Generalized idiopathic epilepsy and epileptic syndromes, not intractable, without status epilepticus: Secondary | ICD-10-CM | POA: Diagnosis not present

## 2017-09-23 DIAGNOSIS — G44219 Episodic tension-type headache, not intractable: Secondary | ICD-10-CM

## 2017-09-23 DIAGNOSIS — Z8742 Personal history of other diseases of the female genital tract: Secondary | ICD-10-CM

## 2017-09-23 DIAGNOSIS — G43009 Migraine without aura, not intractable, without status migrainosus: Secondary | ICD-10-CM

## 2017-09-23 NOTE — Progress Notes (Signed)
Patient: Wendy Livingston MRN: [id number] Sex: female DOB: [birth year]  Provider: Rockwell Germany, NP Location of Care: [facility] Child Neurology  Note type: Routine return visit  History of Present Illness: Referral Source: Dr. Alessandra Grout History from: patient and [facility] chart Chief Complaint: Seizures  Wendy Livingston is a 32 y.o. woman with history of generalized tonic-clonic seizures. She was last seen August 24, 2015 and came in at my request in order to continue to authorize refills of Lamictal. Wendy Livingston has remained seizure free since June 2003. She will remain on medication because of her need to be able to drive.   Wendy Livingston has additional headaches but says that they have not been problematic recently. She admits that she has trouble remember to drink water and occasionally skips meals. She says that she generally gets at least 8 hours of sleep.   Wendy Livingston tells me that she has been diagnosed with PCOS since her last visit. She had questions today regarding this diagnosis and Lamictal. Wendy Livingston has been otherwise generally healthy since she was last seen and has no other health concerns  today other than previously mentioned.  Review of Systems: Please see the HPI for neurologic and other pertinent review of systems. Otherwise, all other systems were reviewed and were negative.    Past Medical History:  Diagnosis Date  . Seizures ([place])   . Vaginal Pap smear, abnormal    Hospitalizations: No., Head Injury: No., Nervous System Infections: No., Immunizations up to date: Yes.   Past Medical History Comments: Her last EEG was in 2003. It was abnormal showing generalized spike wave discharges in the 3 per second rage, consistent with generalized epilepsy. She had a brief 2 1/2 second electrographic seizure during the recording. Intermittent theta slowing was seen emanating from the left posterior temporal region.  Surgical History History reviewed. No pertinent surgical history.  Family  History family history includes Diabetes in her maternal grandfather and mother; Heart attack in her maternal grandmother; Kidney failure in her maternal grandfather; Seizures in her other; Stroke in her paternal grandfather. Family History is otherwise negative for migraines, seizures, cognitive impairment, blindness, deafness, birth defects, chromosomal disorder, autism.  Social History Social History   Socioeconomic History  . Marital status: Single    Spouse name: Not on file  . Number of children: Not on file  . Years of education: Not on file  . Highest education level: Not on file  Occupational History  . Not on file  Social Needs  . Financial resource strain: Not on file  . Food insecurity:    Worry: Not on file    Inability: Not on file  . Transportation needs:    Medical: Not on file    Non-medical: Not on file  Tobacco Use  . Smoking status: Never Smoker  . Smokeless tobacco: Never Used  Substance and Sexual Activity  . Alcohol use: Yes    Alcohol/week: 0.0 oz    Comment: socially  . Drug use: No  . Sexual activity: Yes    Birth control/protection: Pill  Lifestyle  . Physical activity:    Days per week: Not on file    Minutes per session: Not on file  . Stress: Not on file  Relationships  . Social connections:    Talks on phone: Not on file    Gets together: Not on file    Attends religious service: Not on file    Active member of club or organization: Not on file  Attends meetings of clubs or organizations: Not on file    Relationship status: Not on file  Other Topics Concern  . Not on file  Social History Narrative  . Not on file    Allergies No Known Allergies  Physical Exam BP 98/70   Pulse 60   Ht 5\' 8"  (1.727 m)   Wt 173 lb 9.6 oz (78.7 kg)   BMI 26.40 kg/m  General: Well developed, well nourished, seated, in no evident distress, black hair, brown eyes, right handed Head: Head normocephalic and atraumatic.  Oropharynx benign. Neck:  Supple with no carotid bruits Cardiovascular: Regular rate and rhythm, no murmurs Respiratory: Breath sounds clear to auscultation Musculoskeletal: No obvious deformities or scoliosis Skin: No rashes or neurocutaneous lesions  Neurologic Exam Mental Status: Awake and fully alert.  Oriented to place and time.  Recent and remote memory intact.  Attention span, concentration, and fund of knowledge appropriate.  Mood and affect appropriate. Cranial Nerves: Fundoscopic exam reveals sharp disc margins.  Pupils equal, briskly reactive to light.  Extraocular movements full without nystagmus.  Visual fields full to confrontation.  Hearing intact and symmetric to finger rub.  Facial sensation intact.  Face tongue, palate move normally and symmetrically.  Neck flexion and extension normal. Motor: Normal bulk and tone. Normal strength in all tested extremity muscles. Sensory: Intact to touch and temperature in all extremities.  Coordination: Rapid alternating movements normal in all extremities.  Finger-to-nose and heel-to shin performed accurately bilaterally.  Romberg negative. Gait and Station: Arises from chair without difficulty.  Stance is normal. Gait demonstrates normal stride length and balance.   Able to heel, toe and tandem walk without difficulty. Reflexes: 1+ and symmetric. Toes downgoing.  Impression 1.  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  Episodic tension headaches 3.  Migraine without aura 4.  History of PCOS   Recommendations for plan of care The patient's previous [facility] records were reviewed. Wendy Livingston has neither had nor required imaging or lab studies since the last visit. She is a 32 year old woman with history of generalized convulsive epilepsy, tension and migraine headaches and PCOS. She is taking and tolerating Lamictal and has remained seizure free since 2003. Wendy Livingston headaches have not been problematic recently but I reminded her of the need to get enough sleep, to  avoid skipping meals, to drink plenty of water and manage stress as these things are known to trigger headaches. Wendy Livingston had questions about PCOS and Lamictal, as well as pregnancy in epilepsy. I talked with Wendy Livingston about her concerns and answered her questions. I talked with her about planning a pregnancy so that she can be closely monitored. I told Wendy Livingston that women with epilepsy planning a pregnancy should take 5 mg/day of folic acid in the preconception period and throughout pregnancy. Finally, I talked with Wendy Livingston about transitioning to an adult neurology practice. She said that she may be moving to [place] within the next year and asked if she could transition at that time, which is reasonable. I will otherwise see her back in follow up in 1 year or sooner if needed.   The medication list was reviewed and reconciled.  No changes were made in the prescribed medications today.  A complete medication list was provided to the patient.  Allergies as of 09/23/2017   No Known Allergies     Medication List        Accurate as of 09/23/17 11:59 PM. Always use your most recent med list.  diazepam 5 MG tablet Commonly known as:  VALIUM Take 5 mg by mouth every 6 (six) hours as needed for anxiety.   fluconazole 150 MG tablet Commonly known as:  DIFLUCAN Take 1 tablet (150 mg total) by mouth every three (3) days as needed.   LAMICTAL 100 MG tablet Generic drug:  lamoTRIgine TAKE 1 TABLET BY MOUTH TWICE A DAY   nitrofurantoin (macrocrystal-monohydrate) 100 MG capsule Commonly known as:  MACROBID Take 1 capsule (100 mg total) by mouth 2 (two) times daily.   OGESTREL 0.5-50 MG-MCG tablet Generic drug:  norgestrel-ethinyl estradiol Take 1 tablet by mouth daily. Reported on 08/24/2015   omeprazole 20 MG capsule Commonly known as:  PRILOSEC TAKE ONE CAPSULE BY MOUTH EVERY DAY IF NEEDED   TAYTULLA 1-20 MG-MCG(24) Caps Generic drug:  Norethin Ace-Eth Estrad-FE TAKE 1 CAPSULE BY MOUTH EVERY DAY        Total time spent with the patient was 25 minutes, of which 50% or more was spent in counseling and coordination of care.   Rockwell Germany NP-C

## 2017-09-25 ENCOUNTER — Encounter (INDEPENDENT_AMBULATORY_CARE_PROVIDER_SITE_OTHER): Payer: Self-pay | Admitting: Family

## 2017-09-25 DIAGNOSIS — Z8742 Personal history of other diseases of the female genital tract: Secondary | ICD-10-CM | POA: Insufficient documentation

## 2017-09-25 MED ORDER — LAMICTAL 100 MG PO TABS: 100.0000 mg | ORAL_TABLET | Freq: Two times a day (BID) | ORAL | 5 refills | Status: DC

## 2017-09-25 NOTE — Patient Instructions (Signed)
Thank you for coming in today.   Instructions for you until your next appointment are as follows: 1. Continue taking Lamictal as you have been doing. Try not to miss any doses.  2. Let me know if you have any seizures 3. Women planning pregnancy should take folic acid supplements daily before conception occurs. 4. Please plan to return for follow up in 1 year or sooner if needed.  5. Please sign up for MyChart if you have not done so

## 2017-10-05 DIAGNOSIS — F4322 Adjustment disorder with anxiety: Secondary | ICD-10-CM | POA: Diagnosis not present

## 2017-10-24 DIAGNOSIS — L700 Acne vulgaris: Secondary | ICD-10-CM | POA: Diagnosis not present

## 2017-11-19 ENCOUNTER — Other Ambulatory Visit (INDEPENDENT_AMBULATORY_CARE_PROVIDER_SITE_OTHER): Payer: Self-pay | Admitting: Family

## 2017-12-12 DIAGNOSIS — K047 Periapical abscess without sinus: Secondary | ICD-10-CM | POA: Diagnosis not present

## 2017-12-12 DIAGNOSIS — H6980 Other specified disorders of Eustachian tube, unspecified ear: Secondary | ICD-10-CM | POA: Diagnosis not present

## 2018-01-02 DIAGNOSIS — R110 Nausea: Secondary | ICD-10-CM | POA: Diagnosis not present

## 2018-01-02 DIAGNOSIS — R109 Unspecified abdominal pain: Secondary | ICD-10-CM | POA: Diagnosis not present

## 2018-01-03 ENCOUNTER — Other Ambulatory Visit: Payer: Self-pay | Admitting: Family Medicine

## 2018-01-03 DIAGNOSIS — K8000 Calculus of gallbladder with acute cholecystitis without obstruction: Secondary | ICD-10-CM

## 2018-01-03 DIAGNOSIS — K769 Liver disease, unspecified: Secondary | ICD-10-CM

## 2018-01-03 DIAGNOSIS — R1011 Right upper quadrant pain: Secondary | ICD-10-CM

## 2018-01-06 ENCOUNTER — Other Ambulatory Visit: Payer: BLUE CROSS/BLUE SHIELD

## 2018-01-07 ENCOUNTER — Ambulatory Visit
Admission: RE | Admit: 2018-01-07 | Discharge: 2018-01-07 | Disposition: A | Discharge status: Home / Self Care | Payer: BLUE CROSS/BLUE SHIELD | Source: Ambulatory Visit | Attending: Family Medicine | Admitting: Family Medicine

## 2018-01-07 DIAGNOSIS — R1011 Right upper quadrant pain: Secondary | ICD-10-CM | POA: Diagnosis not present

## 2018-01-07 DIAGNOSIS — K8000 Calculus of gallbladder with acute cholecystitis without obstruction: Secondary | ICD-10-CM

## 2018-01-07 DIAGNOSIS — K769 Liver disease, unspecified: Secondary | ICD-10-CM

## 2018-01-27 ENCOUNTER — Telehealth (INDEPENDENT_AMBULATORY_CARE_PROVIDER_SITE_OTHER): Payer: Self-pay | Admitting: Family

## 2018-01-27 DIAGNOSIS — G40309 Generalized idiopathic epilepsy and epileptic syndromes, not intractable, without status epilepticus: Secondary | ICD-10-CM

## 2018-01-27 NOTE — Telephone Encounter (Signed)
Patiet's insurance needs to be updated. It is not active

## 2018-01-27 NOTE — Telephone Encounter (Signed)
°  Who's calling (name and relationship to patient) : Threasa Beards ([facility])  Best contact number: [phone] Provider they see: Otila Kluver  Reason for call: Threasa Beards called and stated that a prior auth will be needed for brand name Lamictal.

## 2018-01-28 DIAGNOSIS — J069 Acute upper respiratory infection, unspecified: Secondary | ICD-10-CM | POA: Diagnosis not present

## 2018-01-28 MED ORDER — LAMOTRIGINE 100 MG PO TABS: ORAL_TABLET | ORAL | 5 refills | Status: DC

## 2018-01-28 NOTE — Telephone Encounter (Signed)
I called the pharmacy and obtained the insurance information, put in PA request through Covermymeds. Her pharmacy insurance is [facility] [employer] ID# [id number]. I will follow up later to see if it has been approved. TG

## 2018-01-28 NOTE — Telephone Encounter (Signed)
I called Wendy Livingston and let her know that her insurance denied the request for coverage for brand Lamictal. I sent in a prescription for generic Lamotrigine and asked her to let me know how she does with that. Maimuna agreed with this plan. TG

## 2018-01-30 DIAGNOSIS — R1011 Right upper quadrant pain: Secondary | ICD-10-CM | POA: Diagnosis not present

## 2018-01-30 DIAGNOSIS — K5900 Constipation, unspecified: Secondary | ICD-10-CM | POA: Diagnosis not present

## 2018-03-17 DIAGNOSIS — K5900 Constipation, unspecified: Secondary | ICD-10-CM | POA: Diagnosis not present

## 2018-04-11 DIAGNOSIS — F4322 Adjustment disorder with anxiety: Secondary | ICD-10-CM | POA: Diagnosis not present

## 2018-04-11 DIAGNOSIS — F451 Undifferentiated somatoform disorder: Secondary | ICD-10-CM | POA: Diagnosis not present

## 2018-04-11 DIAGNOSIS — F3342 Major depressive disorder, recurrent, in full remission: Secondary | ICD-10-CM | POA: Diagnosis not present

## 2018-04-14 DIAGNOSIS — Z3201 Encounter for pregnancy test, result positive: Secondary | ICD-10-CM | POA: Diagnosis not present

## 2018-04-14 DIAGNOSIS — Z713 Dietary counseling and surveillance: Secondary | ICD-10-CM | POA: Diagnosis not present

## 2018-04-14 DIAGNOSIS — Z131 Encounter for screening for diabetes mellitus: Secondary | ICD-10-CM | POA: Diagnosis not present

## 2018-04-14 DIAGNOSIS — Z136 Encounter for screening for cardiovascular disorders: Secondary | ICD-10-CM | POA: Diagnosis not present

## 2018-04-14 DIAGNOSIS — Z13220 Encounter for screening for lipoid disorders: Secondary | ICD-10-CM | POA: Diagnosis not present

## 2018-04-17 ENCOUNTER — Inpatient Hospital Stay (HOSPITAL_COMMUNITY): Payer: BLUE CROSS/BLUE SHIELD

## 2018-04-17 ENCOUNTER — Encounter (HOSPITAL_COMMUNITY): Payer: Self-pay | Admitting: *Deleted

## 2018-04-17 ENCOUNTER — Inpatient Hospital Stay (HOSPITAL_COMMUNITY)
Admission: AD | Admit: 2018-04-17 | Discharge: 2018-04-17 | Disposition: A | Discharge status: Home / Self Care | Payer: BLUE CROSS/BLUE SHIELD | Attending: Obstetrics and Gynecology | Admitting: Obstetrics and Gynecology

## 2018-04-17 DIAGNOSIS — O360110 Maternal care for anti-D [Rh] antibodies, first trimester, not applicable or unspecified: Secondary | ICD-10-CM | POA: Diagnosis not present

## 2018-04-17 DIAGNOSIS — Z3A09 9 weeks gestation of pregnancy: Secondary | ICD-10-CM | POA: Diagnosis not present

## 2018-04-17 DIAGNOSIS — Z3A10 10 weeks gestation of pregnancy: Secondary | ICD-10-CM | POA: Diagnosis not present

## 2018-04-17 DIAGNOSIS — O209 Hemorrhage in early pregnancy, unspecified: Secondary | ICD-10-CM | POA: Diagnosis not present

## 2018-04-17 DIAGNOSIS — O26891 Other specified pregnancy related conditions, first trimester: Secondary | ICD-10-CM | POA: Diagnosis not present

## 2018-04-17 DIAGNOSIS — Z79899 Other long term (current) drug therapy: Secondary | ICD-10-CM | POA: Insufficient documentation

## 2018-04-17 DIAGNOSIS — Z3491 Encounter for supervision of normal pregnancy, unspecified, first trimester: Secondary | ICD-10-CM

## 2018-04-17 DIAGNOSIS — Z6791 Unspecified blood type, Rh negative: Secondary | ICD-10-CM

## 2018-04-17 LAB — CBC
HCT: 38.6 % (ref 36.0–46.0)
HEMOGLOBIN: 12.8 g/dL (ref 12.0–15.0)
MCH: 27 pg (ref 26.0–34.0)
MCHC: 33.2 g/dL (ref 30.0–36.0)
MCV: 81.4 fL (ref 80.0–100.0)
Platelets: 312 10*3/uL (ref 150–400)
RBC: 4.74 MIL/uL (ref 3.87–5.11)
RDW: 12.4 % (ref 11.5–15.5)
WBC: 9.5 10*3/uL (ref 4.0–10.5)
nRBC: 0 % (ref 0.0–0.2)

## 2018-04-17 LAB — URINALYSIS, ROUTINE W REFLEX MICROSCOPIC
Bilirubin Urine: NEGATIVE
Glucose, UA: NEGATIVE mg/dL
KETONES UR: 5 mg/dL — AB
Leukocytes,Ua: NEGATIVE
Nitrite: NEGATIVE
PROTEIN: 30 mg/dL — AB
Specific Gravity, Urine: 1.028 (ref 1.005–1.030)
pH: 7 (ref 5.0–8.0)

## 2018-04-17 LAB — POCT PREGNANCY, URINE: Preg Test, Ur: POSITIVE — AB

## 2018-04-17 LAB — HCG, QUANTITATIVE, PREGNANCY: hCG, Beta Chain, Quant, S: 179130 m[IU]/mL — ABNORMAL HIGH (ref <5)

## 2018-04-17 LAB — ABO/RH: ABO/RH(D): AB NEG

## 2018-04-17 MED ORDER — RHO D IMMUNE GLOBULIN 1500 UNIT/2ML IJ SOSY
300.0000 ug | PREFILLED_SYRINGE | Freq: Once | INTRAMUSCULAR | Status: DC
  Filled 2018-04-17: qty 2

## 2018-04-17 NOTE — Discharge Instructions (Signed)
Rh Incompatibility Rh incompatibility is a condition that occurs during pregnancy if a woman has Rh-negative blood and her baby has Rh-positive blood. "Rh-negative" and "Rh-positive" refer to whether or not the blood has a specific protein found on the surface of red blood cells (Rh factor). If a woman has Rh factor, she is Rh-positive. If she does not have an Rh factor, she is Rh-negative. Having or not having an Rh factor does not affect the mothers general health. However, it can cause problems during pregnancy. What kind of problems can Rh incompatibility cause? During pregnancy, blood from the baby can cross into the mothers bloodstream, especially during delivery. If a mother is Rh-negative and the baby is Rh-positive, the mothers defense (immune) system will react to the baby's blood as if it were a foreign substance and will create certain proteins (antibodies) in response to it. This process is called sensitization. Once the mother is sensitized, her Rh antibodies will cross the placenta in future pregnancies and attack the babys Rh-positive blood as if it were a harmful substance. Rh incompatibility can also happen if a pregnant woman who is Rh-negative receives a donation (transfusion) of Rh-positive blood. How does this condition affect my baby? The Rh antibodies that attack and destroy your babys red blood cells can lead to hemolytic disease in the baby. This is a condition in which red blood cells break down. This can cause:  Yellowing of the skin and the whites of the eyes (jaundice).  Fewer red blood cells in the body (anemia).  Brain damage.  Heart failure.  Death. These antibodies usually do not cause problems during a woman's first pregnancy. This is because blood from the baby often crosses into the mothers bloodstream during delivery, and the baby is born before many of the antibodies can develop. However, once antibodies have formed, they stay in a woman's body. Because  of this, Rh incompatibility is more likely to cause problems in second or later pregnancies (if the baby is Rh-positive). How is this diagnosed?  When you become pregnant, you may have blood tests to determine your blood type and Rh factor. If you are Rh-negative, you may have another blood test called an antibody screen. The antibody screen shows whether you have Rh antibodies in your blood. If you do, it may mean that you have been exposed to Rh-positive blood before, and that you have a risk for Rh incompatibility. To find out whether your baby is developing hemolytic anemia and how serious it is, health care providers may use more advanced tests, such as ultrasound. How is Rh incompatibility treated? This condition is treated with two shots (injections) of medicine called Rho (D) immune globulin. This medicine keeps your body from making antibodies that can cause serious problems for your baby or for future babies. You will get one shot around your seventh month of pregnancy and the other within 72 hours of your baby's birth. If you are Rh-negative, you will need this medicine every time you have a baby with Rh-positive blood. If you are Rh-negative and there is a high risk of blood transfer between you and your baby, you may be given Rho (D) immune globulin. The risk of blood transfer is high if you experience:  Amniocentesis. This is a procedure to remove a small amount of the fluid that surrounds a baby in the uterus (amniotic fluid) so that it can be tested.  A miscarriage or an abortion.  An ectopic pregnancy. This is a pregnancy in  which the fertilized egg attaches (implants) outside the uterus.  Any vaginal bleeding during pregnancy. If you already have antibodies in your blood, your pregnancy will be closely monitored. You will not be given Rho (D) immune globulin because it is not effective in these cases. Summary  Rh incompatibility is a condition that occurs during pregnancy if a  woman has Rh-negative blood and her baby has Rh-positive blood.  If a mother is Rh-negative and the baby is Rh-positive, the mothers immune system will react to the baby's blood as if it were a foreign substance, and will create antibodies.  Rh antibodies that attack and destroy the babys red blood cells can lead to hemolytic disease in the baby.  This condition is treated with a shot of medicine called Rho (D) immune globulin. This medicine keeps the woman's body from making antibodies that can cause serious problems in the baby or future babies. This information is not intended to replace advice given to you by your health care provider. Make sure you discuss any questions you have with your health care provider. Document Released: 07/28/2001 Document Revised: 04/03/2016 Document Reviewed: 04/03/2016 Elsevier Interactive Patient Education  2019 [employer].

## 2018-04-17 NOTE — MAU Provider Note (Signed)
Chief Complaint: Miscarriage   First Provider Initiated Contact with Patient 04/17/18 1509     SUBJECTIVE HPI: Wendy Livingston is a 33 y.o. G1P0000 at [redacted]w[redacted]d who presents to Maternity Admissions reporting vaginal bleeding & low back pain. Had pregnancy confirmed at [facility] with UPT only.  Has not scheduled prenatal care yet.  Has used Dr. Ronita Hipps in the past & plans on using him for prenatal care but hasn't scheduled appt yet.   Reports heavy vaginal bleeding last night & passed a large clot. Bleeding has not continued & has not had bleeding today. Does reports intermittent lower back pain & some intermittent lower abdominal pain that feels like "gas".   Location: back Quality: aching Severity: 3/10 on pain scale Duration: 1 day Timing: intermittent Modifying factors: none Associated signs and symptoms: vaginal bleeding  Past Medical History:  Diagnosis Date  . Seizures ([place])   . Vaginal Pap smear, abnormal    OB History  Gravida Para Term Preterm AB Living  1 0 0 0 0 0  SAB TAB Ectopic Multiple Live Births  0 0 0 0      # Outcome Date GA Lbr Len/2nd Weight Sex Delivery Anes PTL Lv  1 Current            Past Surgical History:  Procedure Laterality Date  . NO PAST SURGERIES     Social History   Socioeconomic History  . Marital status: Single    Spouse name: Not on file  . Number of children: Not on file  . Years of education: Not on file  . Highest education level: Not on file  Occupational History  . Not on file  Social Needs  . Financial resource strain: Not on file  . Food insecurity:    Worry: Not on file    Inability: Not on file  . Transportation needs:    Medical: Not on file    Non-medical: Not on file  Tobacco Use  . Smoking status: Never Smoker  . Smokeless tobacco: Never Used  Substance and Sexual Activity  . Alcohol use: Yes    Alcohol/week: 0.0 standard drinks    Comment: socially  . Drug use: No  . Sexual activity: Yes  Lifestyle   . Physical activity:    Days per week: Not on file    Minutes per session: Not on file  . Stress: Not on file  Relationships  . Social connections:    Talks on phone: Not on file    Gets together: Not on file    Attends religious service: Not on file    Active member of club or organization: Not on file    Attends meetings of clubs or organizations: Not on file    Relationship status: Not on file  . Intimate partner violence:    Fear of current or ex partner: Not on file    Emotionally abused: Not on file    Physically abused: Not on file    Forced sexual activity: Not on file  Other Topics Concern  . Not on file  Social History Narrative  . Not on file   Family History  Problem Relation Age of Onset  . Diabetes Mother   . Seizures Other   . Heart attack Maternal Grandmother   . Diabetes Maternal Grandfather   . Kidney failure Maternal Grandfather   . Stroke Paternal Grandfather    No current facility-administered medications on file prior to encounter.    Current Outpatient  Medications on File Prior to Encounter  Medication Sig Dispense Refill  . lamoTRIgine (LAMICTAL) 100 MG tablet Take 1 tablet twice per day 60 tablet 5  . omeprazole (PRILOSEC) 20 MG capsule TAKE ONE CAPSULE BY MOUTH EVERY DAY IF NEEDED  1   No Known Allergies  I have reviewed patient's Past Medical Hx, Surgical Hx, Family Hx, Social Hx, medications and allergies.   Review of Systems  Constitutional: Negative.   Gastrointestinal: Positive for abdominal pain. Negative for constipation, diarrhea, nausea and vomiting.  Genitourinary: Positive for vaginal bleeding (not currently). Negative for dysuria and vaginal discharge.  Musculoskeletal: Positive for back pain.    OBJECTIVE Patient Vitals for the past 24 hrs:  BP Temp Pulse Resp Height Weight  04/17/18 1649 126/64 - 64 - - -  04/17/18 1340 133/67 99.1 F (37.3 C) 68 18 5\' 9"  (1.753 m) 73.9 kg   Constitutional: Well-developed,  well-nourished female in no acute distress.  Cardiovascular: normal rate & rhythm, no murmur Respiratory: normal rate and effort. Lung sounds clear throughout GI: Abd soft, non-tender, Pos BS x 4. No guarding or rebound tenderness MS: Extremities nontender, no edema, normal ROM Neurologic: Alert and oriented x 4.    LAB RESULTS Results for orders placed or performed during the hospital encounter of 04/17/18 (from the past 24 hour(s))  Pregnancy, urine POC     Status: Abnormal   Collection Time: 04/17/18  1:47 PM  Result Value Ref Range   Preg Test, Ur POSITIVE (A) NEGATIVE  Urinalysis, Routine w reflex microscopic     Status: Abnormal   Collection Time: 04/17/18  1:49 PM  Result Value Ref Range   Color, Urine YELLOW YELLOW   APPearance CLEAR CLEAR   Specific Gravity, Urine 1.028 1.005 - 1.030   pH 7.0 5.0 - 8.0   Glucose, UA NEGATIVE NEGATIVE mg/dL   Hgb urine dipstick MODERATE (A) NEGATIVE   Bilirubin Urine NEGATIVE NEGATIVE   Ketones, ur 5 (A) NEGATIVE mg/dL   Protein, ur 30 (A) NEGATIVE mg/dL   Nitrite NEGATIVE NEGATIVE   Leukocytes,Ua NEGATIVE NEGATIVE   RBC / HPF 0-5 0 - 5 RBC/hpf   WBC, UA 0-5 0 - 5 WBC/hpf   Bacteria, UA RARE (A) NONE SEEN   Squamous Epithelial / LPF 6-10 0 - 5   Mucus PRESENT   CBC     Status: None   Collection Time: 04/17/18  3:33 PM  Result Value Ref Range   WBC 9.5 4.0 - 10.5 K/uL   RBC 4.74 3.87 - 5.11 MIL/uL   Hemoglobin 12.8 12.0 - 15.0 g/dL   HCT 38.6 36.0 - 46.0 %   MCV 81.4 80.0 - 100.0 fL   MCH 27.0 26.0 - 34.0 pg   MCHC 33.2 30.0 - 36.0 g/dL   RDW 12.4 11.5 - 15.5 %   Platelets 312 150 - 400 K/uL   nRBC 0.0 0.0 - 0.2 %  ABO/Rh     Status: None   Collection Time: 04/17/18  3:33 PM  Result Value Ref Range   ABO/RH(D)      AB NEG Performed at [facility], [address] [place], [place] [address]   hCG, quantitative, pregnancy     Status: Abnormal   Collection Time: 04/17/18  3:33 PM  Result Value Ref Range   hCG, Beta  Chain, Quant, S 179,130 (H) <5 mIU/mL    IMAGING No results found.  MAU COURSE Orders Placed This Encounter  Procedures  . US  OB Comp Less 14 Wks  . Urinalysis, Routine w reflex microscopic  . CBC  . hCG, quantitative, pregnancy  . Pregnancy, urine POC  . ABO/Rh  . Discharge patient   Meds ordered this encounter  Medications  . DISCONTD: rho (d) immune globulin (RHIG/RHOPHYLAC) injection 300 mcg    MDM +UPT UA, wet prep, GC/chlamydia, CBC, ABO/Rh, quant hCG, and [place] today to rule out ectopic pregnancy  Ultrasound shows live IUP  Rh negative. Discussed recommendation of rhogam to prevent alloimmunization. Patient declines rhogam today. States she wants to think about it. Discussed recommendation of administration within 72 hrs of bleeding episode. Pt states she will follow up with her doctor or return to MAU.   ASSESSMENT 1. Normal IUP (intrauterine pregnancy) on prenatal ultrasound, first trimester   2. Vaginal bleeding in pregnancy, first trimester   3. Rh negative state in antepartum period, first trimester     PLAN Discharge home in stable condition. Bleeding precautions Pt refused rhogam today -- encouraged to f/u within the next 2 days for administration Start prenatal care  Follow-up Information    Brien Few, MD. Schedule an appointment as soon as possible for a visit.   Specialty:  Obstetrics and Gynecology Contact information: [place] [address] [phone]          Allergies as of 04/17/2018   No Known Allergies     Medication List    STOP taking these medications   diazepam 5 MG tablet Commonly known as:  VALIUM   fluconazole 150 MG tablet Commonly known as:  DIFLUCAN   nitrofurantoin (macrocrystal-monohydrate) 100 MG capsule Commonly known as:  MACROBID   OGESTREL 0.5-50 MG-MCG tablet Generic drug:  norgestrel-ethinyl estradiol   TAYTULLA 1-20 MG-MCG(24) Caps Generic drug:  Norethin Ace-Eth Estrad-FE      TAKE these medications   lamoTRIgine 100 MG tablet Commonly known as:  LAMICTAL Take 1 tablet twice per day   omeprazole 20 MG capsule Commonly known as:  PRILOSEC TAKE ONE CAPSULE BY MOUTH EVERY DAY IF NEEDED        Jorje Guild, NP 04/17/2018  5:52 PM

## 2018-04-17 NOTE — MAU Note (Signed)
[facility]. Pt stated she thinks she had a miscarried  last night. Had heavy bleeding and felt "something come out. No more bleeding to day but c/o back pain and mild discomfort.

## 2018-06-12 DIAGNOSIS — K219 Gastro-esophageal reflux disease without esophagitis: Secondary | ICD-10-CM | POA: Diagnosis not present

## 2018-06-12 DIAGNOSIS — R1011 Right upper quadrant pain: Secondary | ICD-10-CM | POA: Diagnosis not present

## 2018-06-12 DIAGNOSIS — R140 Abdominal distension (gaseous): Secondary | ICD-10-CM | POA: Diagnosis not present

## 2018-06-12 DIAGNOSIS — K5900 Constipation, unspecified: Secondary | ICD-10-CM | POA: Diagnosis not present

## 2018-06-13 DIAGNOSIS — F451 Undifferentiated somatoform disorder: Secondary | ICD-10-CM | POA: Diagnosis not present

## 2018-06-24 ENCOUNTER — Other Ambulatory Visit: Payer: Self-pay | Admitting: Family Medicine

## 2018-06-24 DIAGNOSIS — D1803 Hemangioma of intra-abdominal structures: Secondary | ICD-10-CM

## 2018-08-13 DIAGNOSIS — F4322 Adjustment disorder with anxiety: Secondary | ICD-10-CM | POA: Diagnosis not present

## 2018-08-13 DIAGNOSIS — F4321 Adjustment disorder with depressed mood: Secondary | ICD-10-CM | POA: Diagnosis not present

## 2018-09-01 ENCOUNTER — Other Ambulatory Visit (INDEPENDENT_AMBULATORY_CARE_PROVIDER_SITE_OTHER): Payer: Self-pay | Admitting: Family

## 2018-09-01 DIAGNOSIS — G40309 Generalized idiopathic epilepsy and epileptic syndromes, not intractable, without status epilepticus: Secondary | ICD-10-CM

## 2018-09-01 MED ORDER — LAMOTRIGINE 100 MG PO TABS: ORAL_TABLET | ORAL | 0 refills | Status: DC

## 2018-09-03 ENCOUNTER — Other Ambulatory Visit (INDEPENDENT_AMBULATORY_CARE_PROVIDER_SITE_OTHER): Payer: Self-pay | Admitting: Family

## 2018-09-03 DIAGNOSIS — G40309 Generalized idiopathic epilepsy and epileptic syndromes, not intractable, without status epilepticus: Secondary | ICD-10-CM

## 2018-09-03 MED ORDER — LAMOTRIGINE 100 MG PO TABS: ORAL_TABLET | ORAL | 0 refills | Status: DC

## 2018-09-11 DIAGNOSIS — F332 Major depressive disorder, recurrent severe without psychotic features: Secondary | ICD-10-CM | POA: Diagnosis not present

## 2019-04-16 DIAGNOSIS — Z713 Dietary counseling and surveillance: Secondary | ICD-10-CM | POA: Diagnosis not present

## 2019-04-16 DIAGNOSIS — Z136 Encounter for screening for cardiovascular disorders: Secondary | ICD-10-CM | POA: Diagnosis not present

## 2019-04-16 DIAGNOSIS — Z13220 Encounter for screening for lipoid disorders: Secondary | ICD-10-CM | POA: Diagnosis not present

## 2019-09-16 ENCOUNTER — Other Ambulatory Visit: Payer: Self-pay

## 2019-09-16 ENCOUNTER — Encounter (INDEPENDENT_AMBULATORY_CARE_PROVIDER_SITE_OTHER): Payer: Self-pay | Admitting: Family

## 2019-09-16 ENCOUNTER — Ambulatory Visit (INDEPENDENT_AMBULATORY_CARE_PROVIDER_SITE_OTHER): Payer: BC Managed Care – PPO | Admitting: Family

## 2019-09-16 VITALS — BP 120/72 | HR 64 | Ht 68.0 in | Wt 192.4 lb

## 2019-09-16 DIAGNOSIS — G44219 Episodic tension-type headache, not intractable: Secondary | ICD-10-CM

## 2019-09-16 DIAGNOSIS — G43009 Migraine without aura, not intractable, without status migrainosus: Secondary | ICD-10-CM | POA: Diagnosis not present

## 2019-09-16 DIAGNOSIS — G40309 Generalized idiopathic epilepsy and epileptic syndromes, not intractable, without status epilepticus: Secondary | ICD-10-CM

## 2019-09-16 MED ORDER — LAMOTRIGINE 100 MG PO TABS: ORAL_TABLET | ORAL | 3 refills | Status: DC

## 2019-09-16 NOTE — Progress Notes (Signed)
Wendy Livingston   MRN:  [id number]  05-Sep-1985   Provider: Rockwell Germany NP-C Location of Care: [facility]  Visit type: Routine visit  Last visit: 09/23/2017  Referral source: Dr. Alessandra Grout History from: patient and chcn chart  Brief history:  History of generalized tonic-clonic seizures and headaches. She is taking and tolerating Lamotrigine and has remained seizure free since June 2003.  Today's concerns:  Wendy Livingston reports today that she has remained seizure free since her last visit. She has been having headaches for the last couple of weeks and is concerned about that. Wendy Livingston reports that her work schedule changed around the same time from evening shift to day shift and that she has had trouble going to sleep and staying asleep since this change. With the headaches she reports either relentless or pounding pain in the back of her head, some nausea and sensitivity to light. Ibuprofen and rest have given her relief of symptoms.   Wendy Livingston has been otherwise generally healthy since he was last seen. She has no other health concerns today other than previously mentioned.  Review of systems: Please see HPI for neurologic and other pertinent review of systems. Otherwise all other systems were reviewed and were negative.  Problem List: Patient Active Problem List   Diagnosis Date Noted  . History of PCOS 09/25/2017  . Episodic tension-type headache, not intractable 08/25/2015  . Migraine without aura and without status migrainosus, not intractable 08/25/2015  . Encounter for long-term current use of medication 11/13/2013  . 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 Seizures ([place])   . Vaginal Pap smear, abnormal     Past medical history comments: See HPI Copied from previous record: Her last EEG was in 2003. It was abnormal showing generalized spike wave discharges in the 3 per second rage, consistent with  generalized epilepsy. She had a brief 2 1/2 second electrographic seizure during the recording. Intermittent theta slowing was seen emanating from the left posterior temporal region.  She was diagnosed with PCOS in 2019  Surgical history: Past Surgical History:  Procedure Laterality Date  . NO PAST SURGERIES       Family history: family history includes Diabetes in her maternal grandfather and mother; Heart attack in her maternal grandmother; Kidney failure in her maternal grandfather; Seizures in an other family member; Stroke in her paternal grandfather.   Social history: Social History   Socioeconomic History  . Marital status: Single    Spouse name: Not on file  . Number of children: Not on file  . Years of education: Not on file  . Highest education level: Not on file  Occupational History  . Not on file  Tobacco Use  . Smoking status: Never Smoker  . Smokeless tobacco: Never Used  Substance and Sexual Activity  . Alcohol use: Yes    Alcohol/week: 0.0 standard drinks    Comment: socially  . Drug use: No  . Sexual activity: Yes  Other Topics Concern  . Not on file  Social History Narrative  . Not on file   Social Determinants of Health   Financial Resource Strain:   . Difficulty of Paying Living Expenses:   Food Insecurity:   . Worried About Charity fundraiser in the Last Year:   . Arboriculturist in the Last Year:   Transportation Needs:   . Film/video editor (Medical):   Marland Kitchen Lack of  Transportation (Non-Medical):   Physical Activity:   . Days of Exercise per Week:   . Minutes of Exercise per Session:   Stress:   . Feeling of Stress :   Social Connections:   . Frequency of Communication with Friends and Family:   . Frequency of Social Gatherings with Friends and Family:   . Attends Religious Services:   . Active Member of Clubs or Organizations:   . Attends Archivist Meetings:   Marland Kitchen Marital Status:   Intimate Partner Violence:   . Fear of  Current or Ex-Partner:   . Emotionally Abused:   Marland Kitchen Physically Abused:   . Sexually Abused:    Past/failed meds:  Allergies: No Known Allergies   Immunizations:  There is no immunization history on file for this patient.   Diagnostics/Screenings: Copied from previous record: Her last EEG was in 2003. It was abnormal showing generalized spike wave discharges in the 3 per second rage, consistent with generalized epilepsy. She had a brief 2 1/2 second electrographic seizure during the recording. Intermittent theta slowing was seen emanating from the left posterior temporal region.  Physical Exam: BP 120/72   Pulse 64   Ht 5\' 8"  (1.727 m)   Wt 192 lb 6.4 oz (87.3 kg)   BMI 29.25 kg/m   General: Well developed, well nourished woman, seated in exam room, in no evident distress, black hair, brown eyes, right handed Head: Head normocephalic and atraumatic.  Oropharynx benign. Neck: Supple with no carotid bruits Cardiovascular: Regular rate and rhythm, no murmurs Respiratory: Breath sounds clear to auscultation Musculoskeletal: No obvious deformities or scoliosis Skin: No rashes or neurocutaneous lesions  Neurologic Exam Mental Status: Awake and fully alert.  Oriented to place and time.  Recent and remote memory intact.  Attention span, concentration, and fund of knowledge appropriate.  Mood and affect appropriate. Cranial Nerves: Fundoscopic exam reveals sharp disc margins.  Pupils equal, briskly reactive to light.  Extraocular movements full without nystagmus.  Visual fields full to confrontation.  Hearing intact and symmetric to finger rub.  Facial sensation intact.  Face tongue, palate move normally and symmetrically.  Neck flexion and extension normal. Motor: Normal bulk and tone. Normal strength in all tested extremity muscles. Sensory: Intact to touch and temperature in all extremities.  Coordination: Rapid alternating movements normal in all extremities.  Finger-to-nose and  heel-to shin performed accurately bilaterally.  Romberg negative. Gait and Station: Arises from chair without difficulty.  Stance is normal. Gait demonstrates normal stride length and balance.   Able to heel, toe and tandem walk without difficulty. Reflexes: 1+ and symmetric. Toes downgoing.  Impression: 1. 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 Episodic tension headaches 3. Migraine without aura 4. History of PCOS  Recommendations for plan of care: The patient's previous Wendy Livingston records were reviewed. Wendy Livingston has neither had nor required imaging or lab studies since the last visit. She is a 34 year old woman with history of generalized convulsive epilepsy in good control on Lamotrigine. She also has history of migraine and tension headaches. Wendy Livingston has been experiencing headaches for the last couple of weeks. Her examination is normal. She has had a change in work schedule and has had problems going to sleep. I talked with Wendy Livingston about sleep deprivation or disruption being a known trigger for headaches. I reviewed sleep hygiene with her and how to transition to new sleep pattern. I asked her to let me know if the headaches continue. If she has one or  more migraines per week, I would consider placing her on Topiramate for migraine prophylaxis. I will otherwise see her back in follow up in 1 year or sooner if needed. Wendy Livingston agreed with the plans made today.   The medication list was reviewed and reconciled. No changes were made in the prescribed medications today. A complete medication list was provided to the patient.  Allergies as of 09/16/2019   No Known Allergies     Medication List       Accurate as of September 16, 2019 11:59 PM. If you have any questions, ask your nurse or doctor.        STOP taking these medications   omeprazole 20 MG capsule Commonly known as: PRILOSEC Stopped by: Rockwell Germany, NP     TAKE these medications   diazepam 5 MG tablet Commonly known as:  VALIUM Take by mouth.   lamoTRIgine 100 MG tablet Commonly known as: LaMICtal Take 1 tablet twice per day       Total time spent with the patient was 25 minutes, of which 50% or more was spent in counseling and coordination of care.  Rockwell Germany NP-C [facility] Ph. [phone] Fax [phone]

## 2019-09-19 ENCOUNTER — Encounter (INDEPENDENT_AMBULATORY_CARE_PROVIDER_SITE_OTHER): Payer: Self-pay | Admitting: Family

## 2019-09-19 NOTE — Patient Instructions (Signed)
Thank you for coming in today.   Instructions for you until your next appointment are as follows: 1. Continue taking the Lamotrigine as prescribed. Try not to miss any doses 2. Let me know if you have any seizures 3. Your headaches are likely due to change in sleep pattern. Work on getting more sleep as we discussed today.  4. If the headaches continue, let me know and we can consider a migraine preventative medication.  5. Please sign up for MyChart if you have not done so 6. Please plan to return for follow up in one year or sooner if needed.

## 2020-02-08 IMAGING — US US ABDOMEN LIMITED
1 series · 13 of 25 positions shown · non-contrast
Comparison: None.

Addendum:
CLINICAL DATA: Right upper quadrant abdominal pain for the past
week. History of gallstones and cholecystitis. History of liver
disease.

EXAM:
ULTRASOUND ABDOMEN LIMITED RIGHT UPPER QUADRANT

[Series 1: us abdomen limited · 0.25mm/px · 13 of 45 slices shown]
[im 1/45]
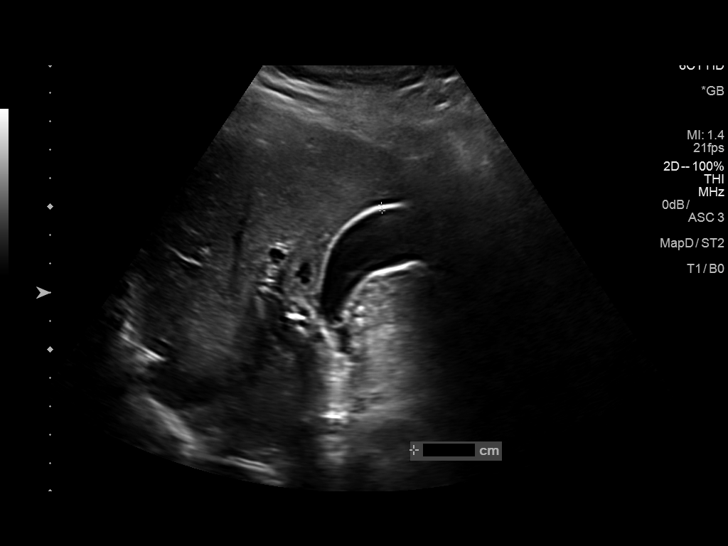
[im 4/45]
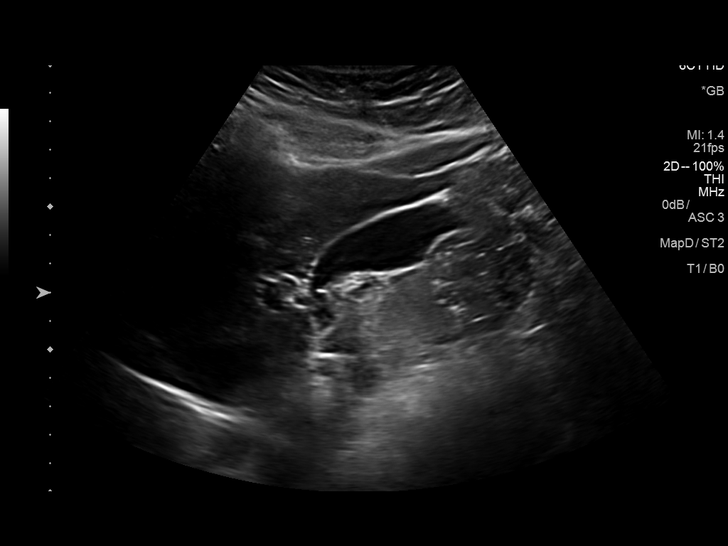
[im 8/45]
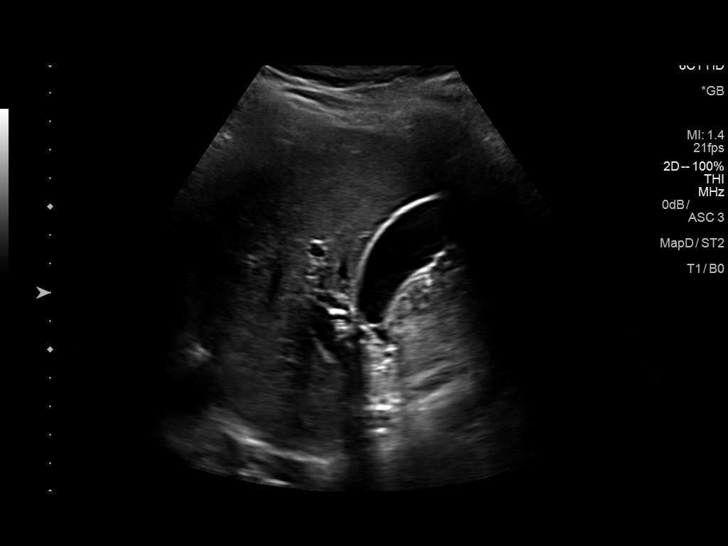
[im 12/45]
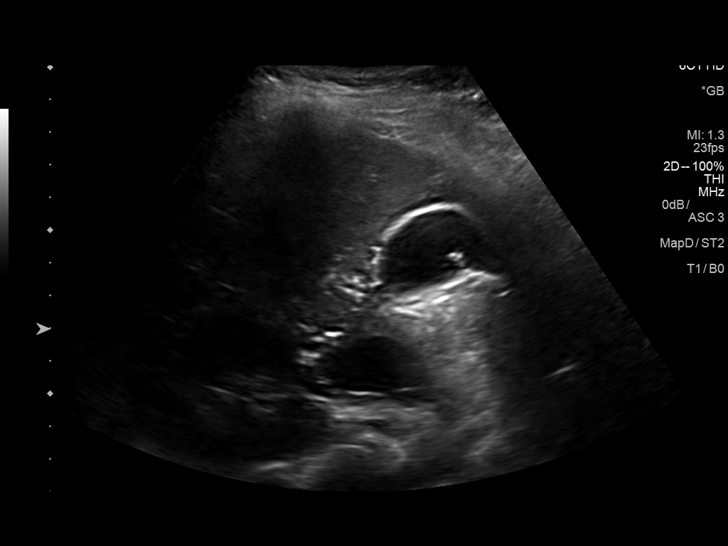
[im 15/45]
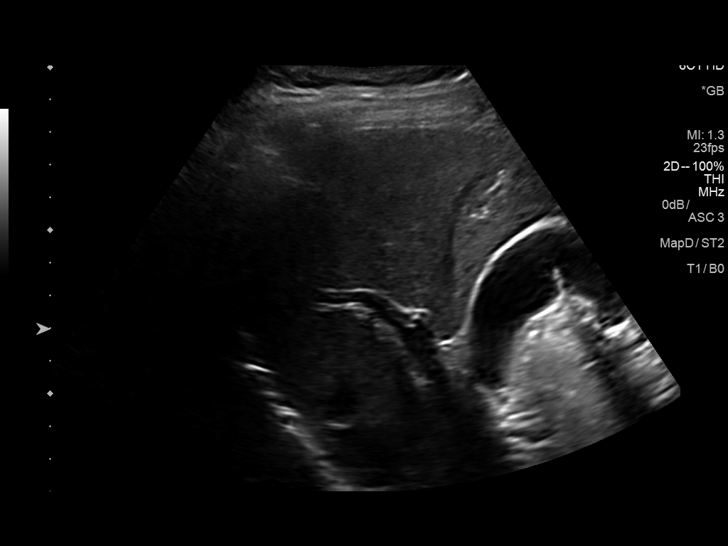
[im 19/45]
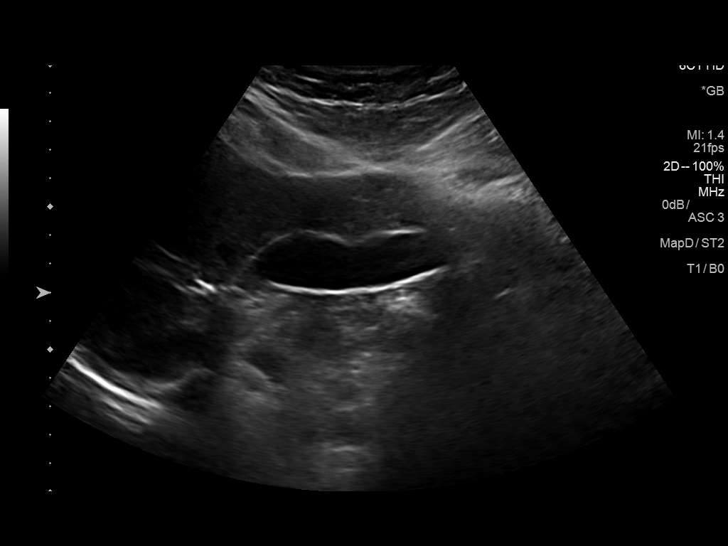
[im 23/45]
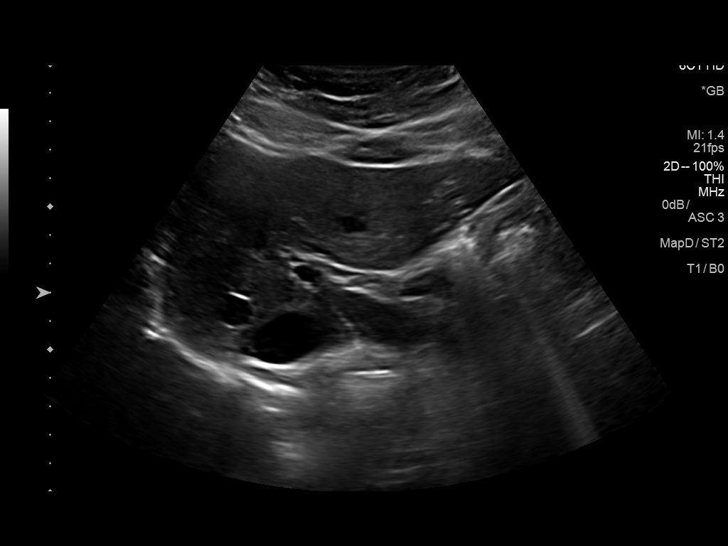
[im 26/45]
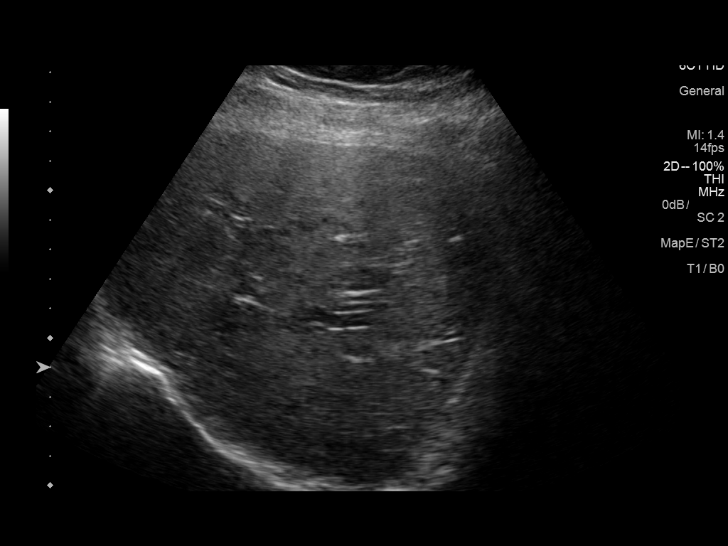
[im 30/45]
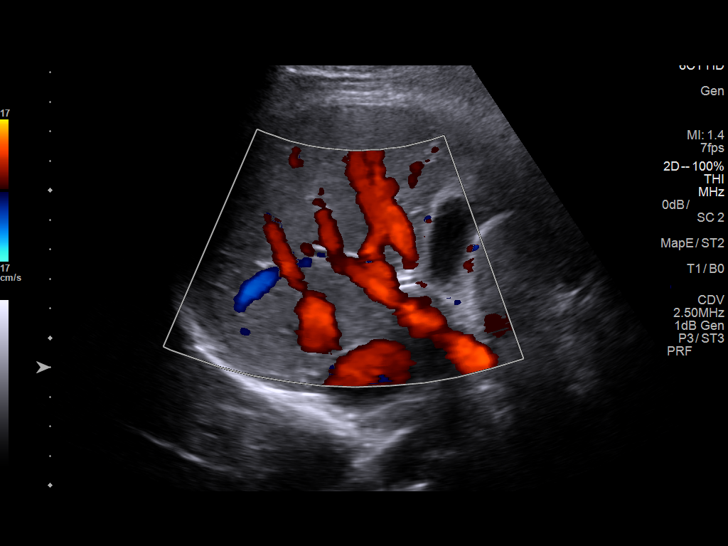
[im 34/45]
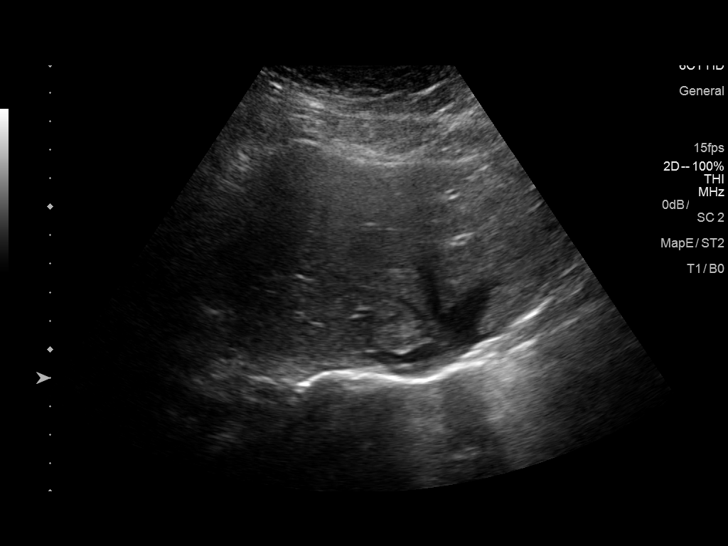
[im 37/45]
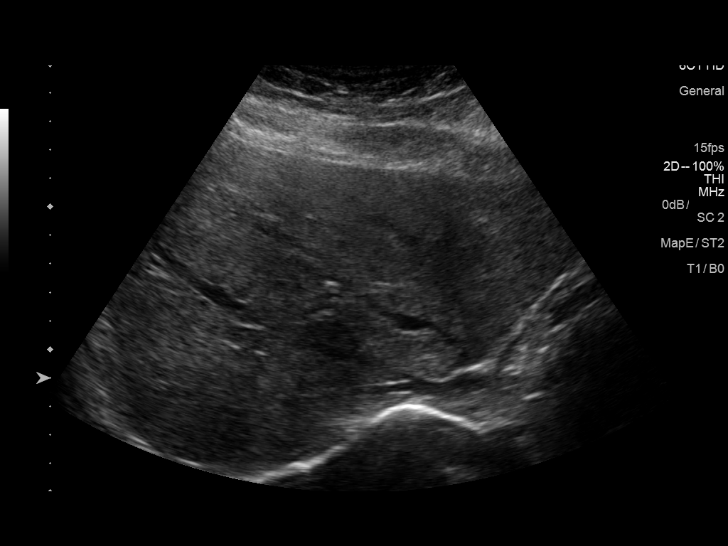
[im 41/45]
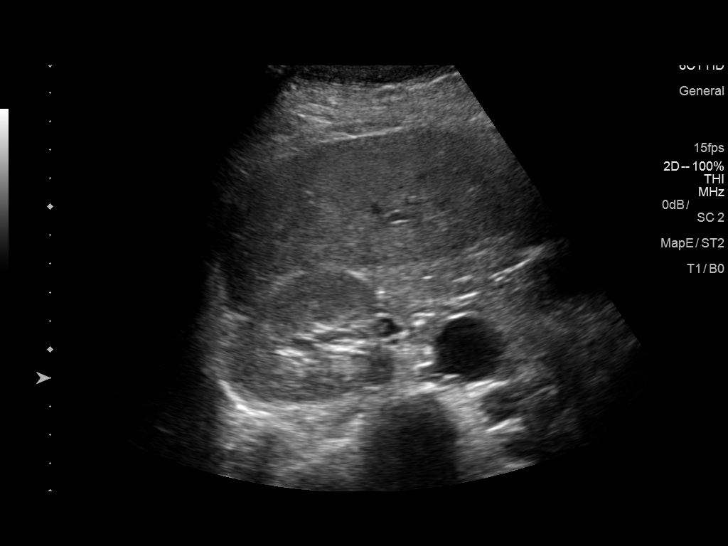
[im 45/45]
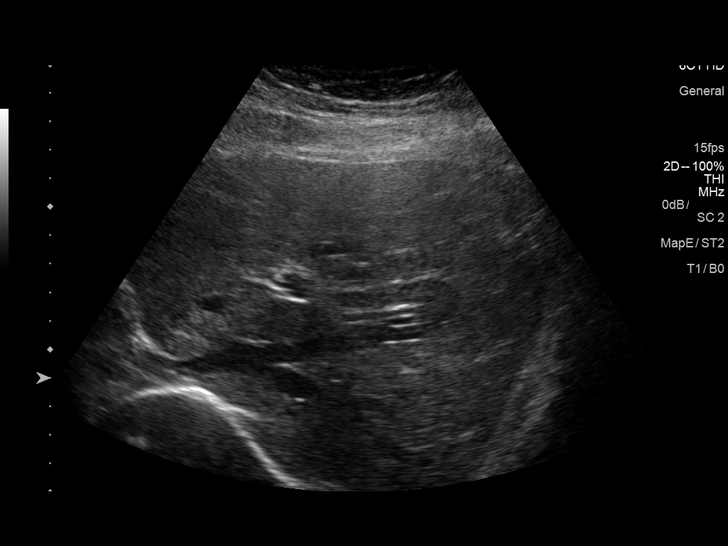

[13 of 25 positions shown; findings below may reference images not displayed]

FINDINGS: Gallbladder:

The gallbladder is adequately distended. There is an echogenic
nonshadowing non mobile focus in the gallbladder which may reflect a
3 mm polyp. No definite stones are observed. There is no gallbladder
wall thickening, pericholecystic fluid, or positive sonographic
Murphy's sign.

Common bile duct:

Diameter: 4 mm

Liver:

The hepatic echotexture is normal. The surface contour is smooth.
There is an echogenic focus measuring 1.4 x 1.1 x 1.7 cm at the Arelys
confluence of the portal veins near the porta hepatis. Portal vein
is patent on color Doppler imaging with normal direction of blood
flow towards the liver.
IMPRESSION: Probable gallbladder polyp. No sonographic evidence of acute
cholecystitis. If there are clinical concerns of chronic
cholecystitis, a nuclear medicine hepatobiliary scan with
gallbladder ejection fraction determination may be useful.

Probable hemangioma near the porta hepatis. Six-month follow-up
liver ultrasound is recommended.

ADDENDUM:
In the body of the port under liver there is an air. The third
sentence should read "there is an echogenic focus measuring 1.4 x
1.1 x 1.7 cm at the confluence of the portal veins". The statement
of "near the porta hepatis" is erroneous. Also, in the impression
the next to last sentence should read "probable hemangioma near the
portal venous confluence."

*** End of Addendum ***

## 2020-02-10 DIAGNOSIS — J019 Acute sinusitis, unspecified: Secondary | ICD-10-CM | POA: Diagnosis not present

## 2020-02-10 DIAGNOSIS — U071 COVID-19: Secondary | ICD-10-CM | POA: Diagnosis not present

## 2020-02-10 DIAGNOSIS — R0981 Nasal congestion: Secondary | ICD-10-CM | POA: Diagnosis not present

## 2020-02-11 ENCOUNTER — Encounter: Payer: Self-pay | Admitting: Nurse Practitioner

## 2020-04-08 DIAGNOSIS — Z6828 Body mass index (BMI) 28.0-28.9, adult: Secondary | ICD-10-CM | POA: Diagnosis not present

## 2020-04-08 DIAGNOSIS — Z136 Encounter for screening for cardiovascular disorders: Secondary | ICD-10-CM | POA: Diagnosis not present

## 2020-04-08 DIAGNOSIS — Z13220 Encounter for screening for lipoid disorders: Secondary | ICD-10-CM | POA: Diagnosis not present

## 2020-04-08 DIAGNOSIS — Z713 Dietary counseling and surveillance: Secondary | ICD-10-CM | POA: Diagnosis not present

## 2020-06-26 ENCOUNTER — Encounter (INDEPENDENT_AMBULATORY_CARE_PROVIDER_SITE_OTHER): Payer: Self-pay

## 2020-09-16 ENCOUNTER — Other Ambulatory Visit (INDEPENDENT_AMBULATORY_CARE_PROVIDER_SITE_OTHER): Payer: Self-pay | Admitting: Family

## 2020-09-16 DIAGNOSIS — G40309 Generalized idiopathic epilepsy and epileptic syndromes, not intractable, without status epilepticus: Secondary | ICD-10-CM

## 2020-09-16 NOTE — Telephone Encounter (Signed)
I left patient a voicemail requesting a call back to schedule. Ellouise Newer

## 2020-10-26 DIAGNOSIS — H65111 Acute and subacute allergic otitis media (mucoid) (sanguinous) (serous), right ear: Secondary | ICD-10-CM | POA: Diagnosis not present

## 2020-10-26 DIAGNOSIS — H6981 Other specified disorders of Eustachian tube, right ear: Secondary | ICD-10-CM | POA: Diagnosis not present

## 2020-11-19 IMAGING — US US OB COMP LESS 14 WK
1 series · 15 of 23 positions shown · non-contrast
Comparison: None.

CLINICAL DATA: Vaginal bleeding last night, low back pain
intermittently since yesterday.

EXAM:
OBSTETRIC <14 WK ULTRASOUND
TECHNIQUE: Transabdominal ultrasound was performed for evaluation of the
gestation as well as the maternal uterus and adnexal regions.

[Series 1: us ob comp less 14 wk · 15 of 23 slices shown]
[im 1/23]
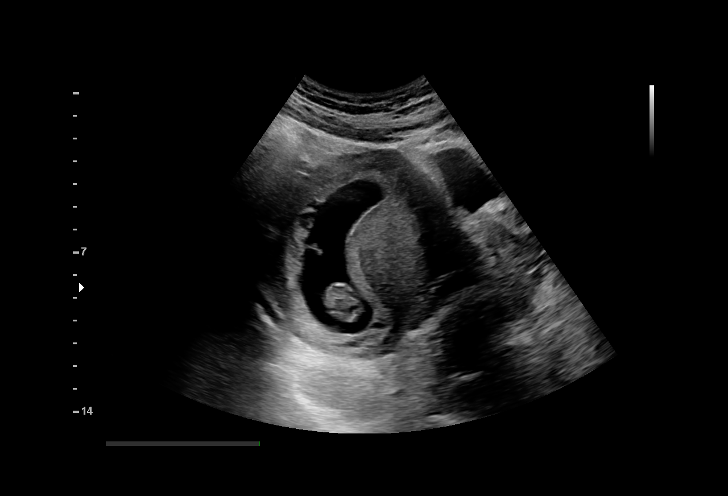
[im 3/23]
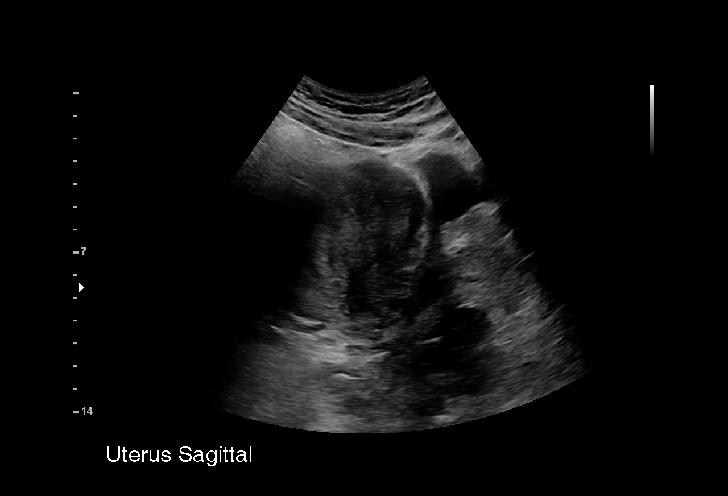
[im 4/23]
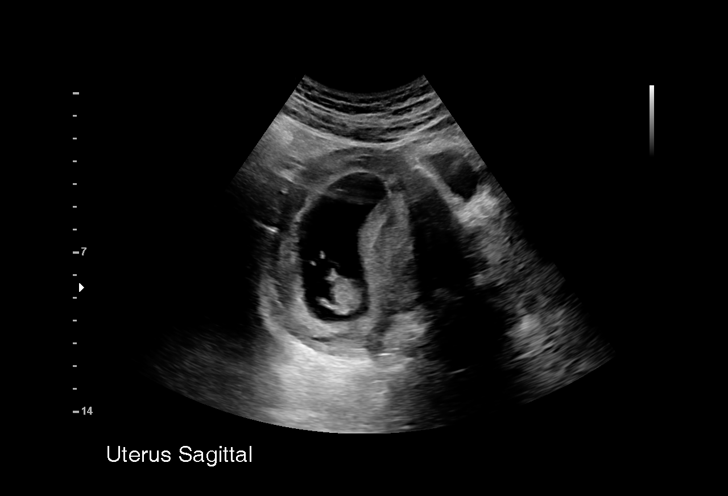
[im 6/23]
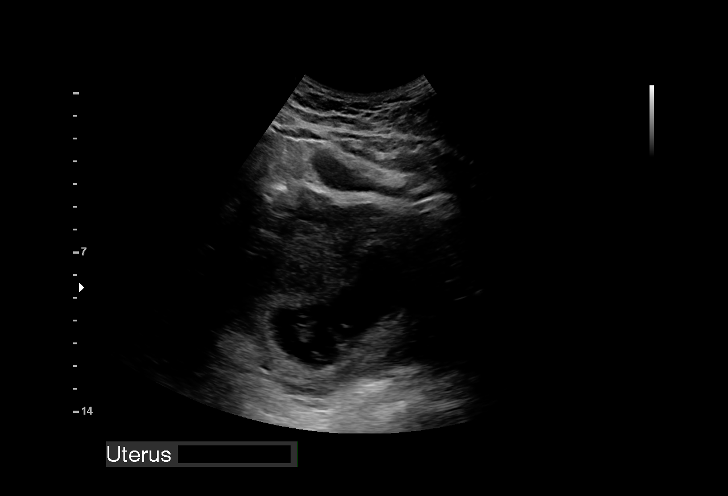
[im 7/23]
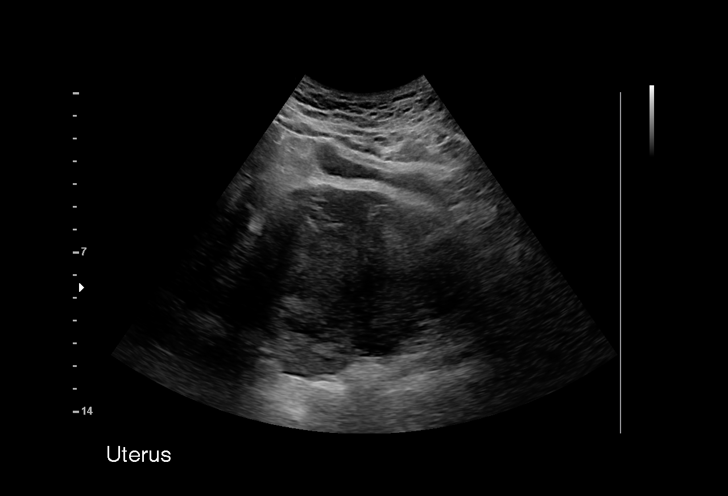
[im 9/23]
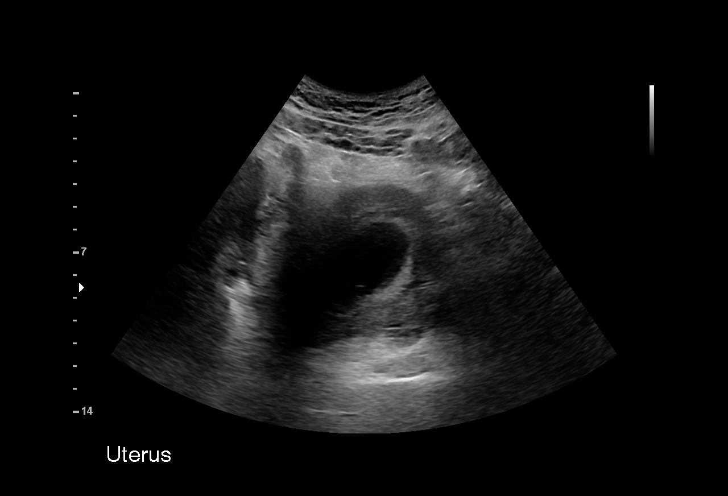
[im 10/23]
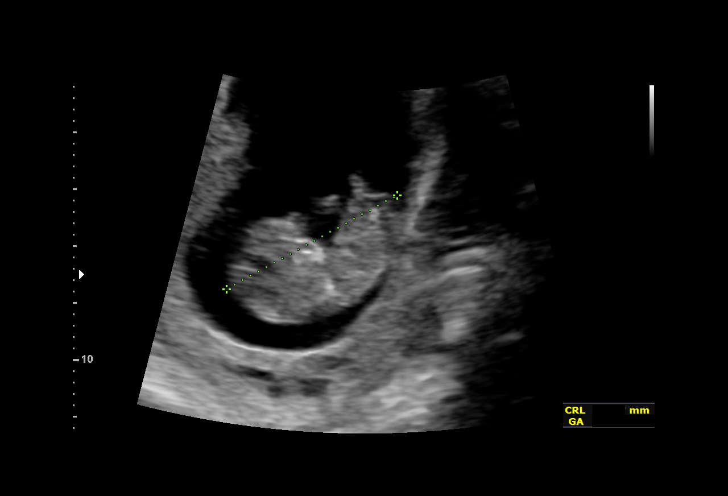
[im 12/23]
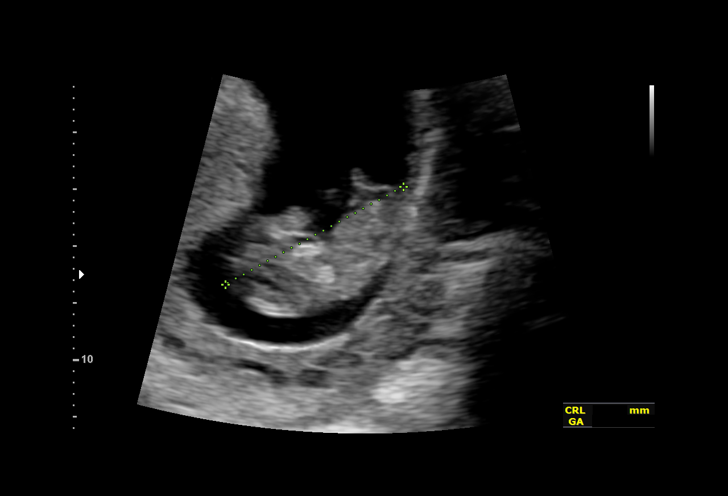
[im 14/23]
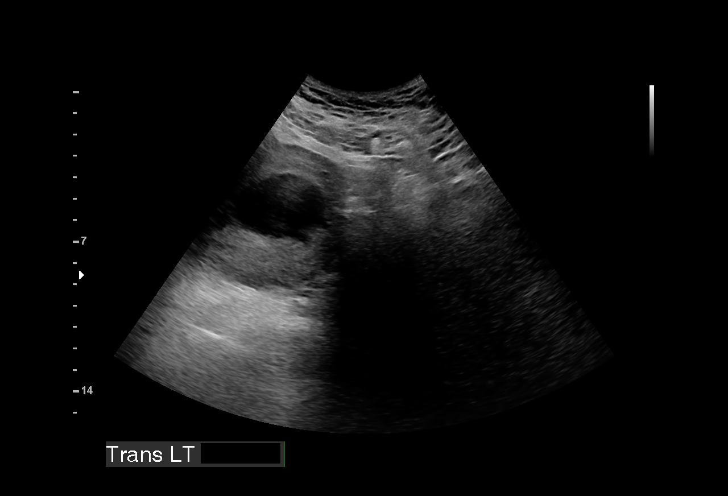
[im 15/23]
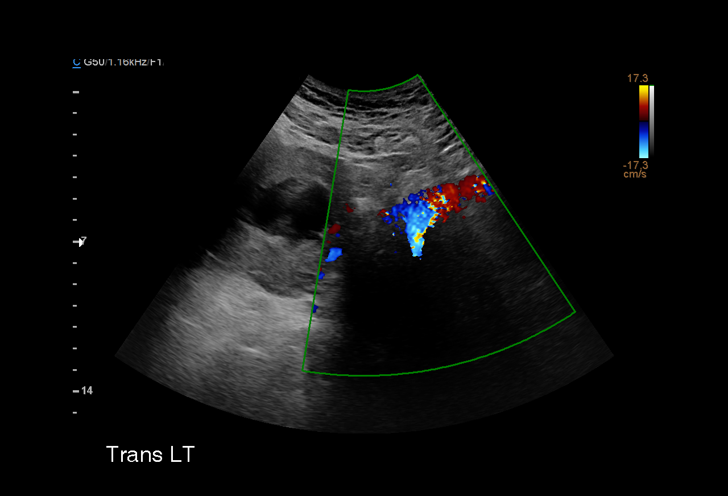
[im 17/23]
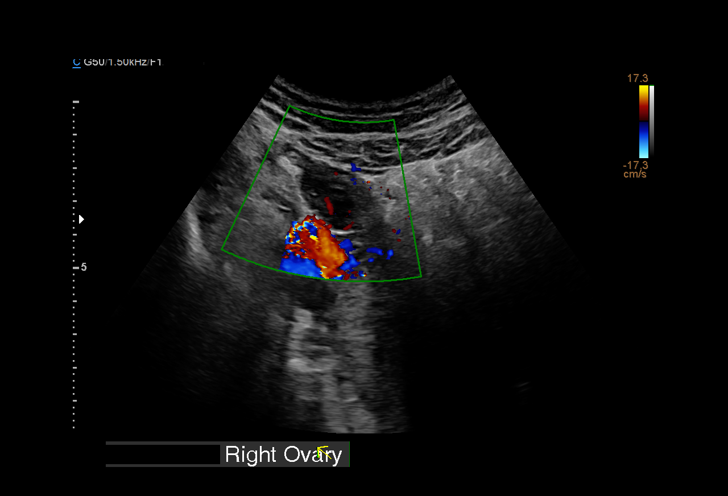
[im 18/23]
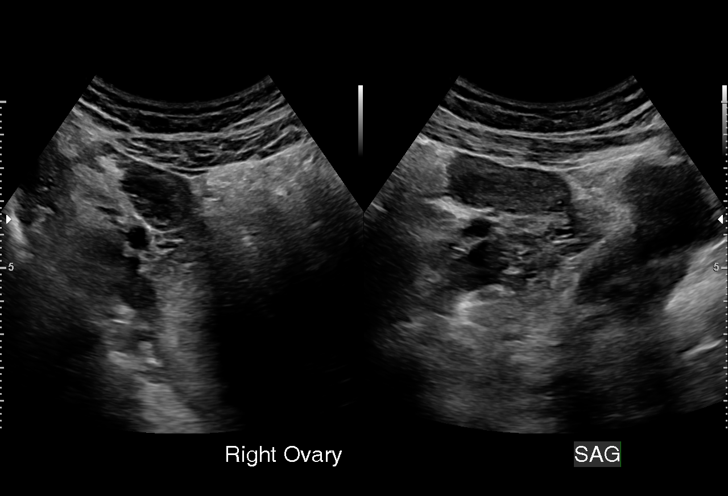
[im 20/23]
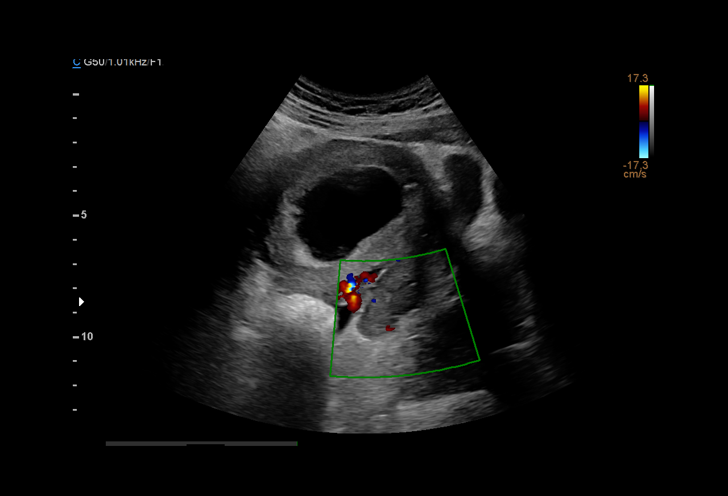
[im 21/23]
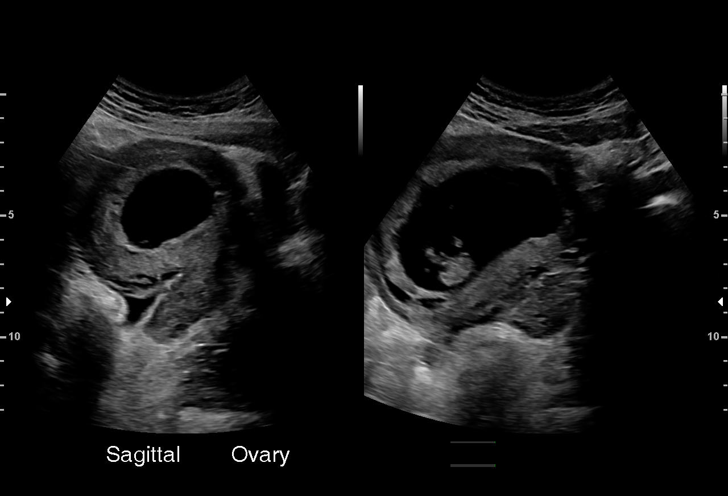
[im 23/23]
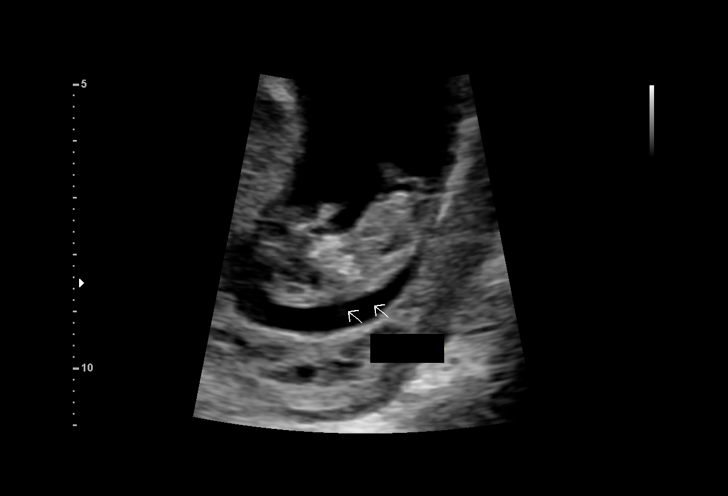

[15 of 23 positions shown; findings below may reference images not displayed]

FINDINGS: Intrauterine gestational sac: Single

Yolk sac:  Not visualized

Embryo:  Visualized.

Cardiac Activity: Visualized.

Heart Rate: 164 bpm

MSD:    mm    w     d

CRL:   35 mm   10 w 2 d                  US EDC: 11/11/2018

Subchorionic hemorrhage:  None visualized.

Maternal uterus/adnexae: Small probable corpus luteum within the
RIGHT ovary. LEFT ovary appears normal. No mass or free fluid within
either adnexal region.
IMPRESSION: Single live intrauterine pregnancy with estimated gestational age of
10 weeks and 2 days. No subchorionic hemorrhage or other
complicating features.

## 2021-01-23 ENCOUNTER — Telehealth (INDEPENDENT_AMBULATORY_CARE_PROVIDER_SITE_OTHER): Payer: Self-pay | Admitting: Family

## 2021-01-23 NOTE — Telephone Encounter (Signed)
I called Detta and left a message requesting a call back. TG

## 2021-01-23 NOTE — Telephone Encounter (Signed)
  Who's calling (name and relationship to patient) : Diannia Ruder, self  Best contact number: [phone]  Provider they see: Dr. Cloretta Ned  Reason for call: Kayana has called in stating that she has learned that she is pregnant and wants to know if it is safe to still take Lamictal. Katiya has requested a call back.    PRESCRIPTION REFILL ONLY  Name of prescription:  Pharmacy:

## 2021-01-25 NOTE — Telephone Encounter (Signed)
Wendy Livingston called back. She said that she had a positive pregnancy test over the weekend and was working on getting an appointment with OB/GYN to confirm the pregnancy. I explained to Wendy Livingston that Lamictal is considered to be safe in pregnancy. I encouraged her to get established with OB/GYN regarding the pregnancy. I also instructed her to call and schedule a follow up appointment with me as it has been >1 year since she has been seen. She agreed with these plans. TG

## 2021-01-31 DIAGNOSIS — Z3201 Encounter for pregnancy test, result positive: Secondary | ICD-10-CM | POA: Diagnosis not present

## 2021-01-31 DIAGNOSIS — Z3689 Encounter for other specified antenatal screening: Secondary | ICD-10-CM | POA: Diagnosis not present

## 2021-04-10 DIAGNOSIS — F4321 Adjustment disorder with depressed mood: Secondary | ICD-10-CM | POA: Diagnosis not present

## 2021-04-10 DIAGNOSIS — F330 Major depressive disorder, recurrent, mild: Secondary | ICD-10-CM | POA: Diagnosis not present

## 2021-04-17 DIAGNOSIS — Z136 Encounter for screening for cardiovascular disorders: Secondary | ICD-10-CM | POA: Diagnosis not present

## 2021-04-17 DIAGNOSIS — Z13220 Encounter for screening for lipoid disorders: Secondary | ICD-10-CM | POA: Diagnosis not present

## 2021-04-17 DIAGNOSIS — Z6831 Body mass index (BMI) 31.0-31.9, adult: Secondary | ICD-10-CM | POA: Diagnosis not present

## 2021-04-17 DIAGNOSIS — Z713 Dietary counseling and surveillance: Secondary | ICD-10-CM | POA: Diagnosis not present

## 2021-05-01 ENCOUNTER — Ambulatory Visit (INDEPENDENT_AMBULATORY_CARE_PROVIDER_SITE_OTHER): Payer: BC Managed Care – PPO | Admitting: Family

## 2021-05-01 ENCOUNTER — Encounter: Payer: Self-pay | Admitting: Neurology

## 2021-05-01 ENCOUNTER — Other Ambulatory Visit: Payer: Self-pay

## 2021-05-01 ENCOUNTER — Encounter (INDEPENDENT_AMBULATORY_CARE_PROVIDER_SITE_OTHER): Payer: Self-pay | Admitting: Family

## 2021-05-01 VITALS — BP 114/66 | Wt 207.0 lb

## 2021-05-01 DIAGNOSIS — G40309 Generalized idiopathic epilepsy and epileptic syndromes, not intractable, without status epilepticus: Secondary | ICD-10-CM | POA: Diagnosis not present

## 2021-05-01 MED ORDER — LAMOTRIGINE 100 MG PO TABS: ORAL_TABLET | ORAL | 1 refills | Status: DC

## 2021-05-01 NOTE — Patient Instructions (Addendum)
It was a pleasure to see you today! ? ?Instructions for you until your next appointment are as follows: ?Continue taking the Lamotrigine as prescribed. Try not to miss any doses. Consider setting an alarm in your phone or other reminder to take your medication each day.  ?If you have any more seizures or any funny feelings like seizures may occur, let me know.  ?I will refer you to Dr Ellouise Newer with [facility] for your ongoing care. She is a neurologist with a special interest in epilepsy. Her office number is [phone].  ?I will manage any concerns until you get established with Dr Delice Lesch.  ?Please sign up for MyChart if you have not done so. ? ?  ?Feel free to contact our office during normal business hours at [phone] with questions or concerns. If there is no answer or the call is outside business hours, please leave a message and our clinic staff will call you back within the next business day.  If you have an urgent concern, please stay on the line for our after-hours answering service and ask for the on-call neurologist.   ?  ?I also encourage you to use MyChart to communicate with me more directly. If you have not yet signed up for MyChart within [facility], the front desk staff can help you. However, please note that this inbox is NOT monitored on nights or weekends, and response can take up to 2 business days.  Urgent matters should be discussed with the on-call pediatric neurologist.  ? ?At [facility], we are committed to providing exceptional care. You will receive a patient satisfaction survey through text or email regarding your visit today. Your opinion is important to me. Comments are appreciated.   ?

## 2021-05-01 NOTE — Progress Notes (Unsigned)
Wendy Livingston   MRN:  [id number]  1985/02/24   Provider: Rockwell Germany NP-C Location of Care: [facility]  Visit type:   Last visit:   Referral source:  History from:   Brief history:  Copied from previous record:   Today's concerns:  *** has been otherwise generally healthy since he was last seen. Neither *** nor mother have other health concerns for *** today other than previously mentioned.   Review of systems: Please see HPI for neurologic and other pertinent review of systems. Otherwise all other systems were reviewed and were negative.  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   Social history: Social History   Socioeconomic History   Marital status: Single    Spouse name: Not on file   Number of children: Not on file   Years of education: Not on file   Highest education level: Not on file  Occupational History   Not on file  Tobacco Use   Smoking status: Never    Passive exposure: Never   Smokeless tobacco: Never  Substance and Sexual Activity   Alcohol use: Yes    Alcohol/week: 0.0 standard drinks    Comment:  socially   Drug use: No   Sexual activity: Yes  Other Topics Concern   Not on file  Social History Narrative   Works full time at [employer] Determinants of Radio broadcast assistant Strain: Not on file  Food Insecurity: Not on file  Transportation Needs: Not on file  Physical Activity: Not on file  Stress: Not on file  Social Connections: Not on file  Intimate Partner Violence: Not on file      Past/failed meds: Copied from previous record:  Allergies: No Known Allergies    Immunizations:  There is no immunization history on file for this patient.    Diagnostics/Screenings: Copied from previous record:   Physical Exam: BP 114/66    Wt 207 lb (93.9 kg)    Breastfeeding No    BMI 31.47 kg/m     Impression: Generalized convulsive epilepsy ([place]) - Plan: lamoTRIgine (LAMICTAL) 100 MG tablet, Ambulatory referral to Neurology    Recommendations for plan of care: The patient's previous [facility] records were reviewed. *** has neither had nor required imaging or lab studies since the last visit.   The medication list was reviewed and reconciled. No changes were made in the prescribed medications today. A complete medication list was provided to the patient.  Orders Placed This Encounter  Procedures   Ambulatory referral to Neurology    Referral Priority:   Routine    Referral Type:   Consultation  Referral Reason:   Specialty Services Required    Referred to Provider:   Cameron Sprang, MD    Requested Specialty:   Neurology    Number of Visits Requested:   1    No follow-ups on file.   Allergies as of 05/01/2021   No Known Allergies      Medication List        Accurate as of May 01, 2021 11:58 AM. If you have any questions, ask your nurse or doctor.          STOP taking these medications    diazepam 5 MG tablet Commonly known as: VALIUM Stopped by: Rockwell Germany, NP       TAKE these medications    lamoTRIgine 100 MG  tablet Commonly known as: LAMICTAL TAKE 1 TABLET BY MOUTH TWICE A DAY            I discussed this patient's care with the multiple providers involved in his care today to develop this assessment and plan.   Total time spent with the patient was *** minutes, of which 50% or more was spent in counseling and coordination of care.  Rockwell Germany NP-C [facility] Ph. [phone] Fax [phone]

## 2021-05-03 ENCOUNTER — Encounter (INDEPENDENT_AMBULATORY_CARE_PROVIDER_SITE_OTHER): Payer: Self-pay | Admitting: Family

## 2021-05-03 DIAGNOSIS — N760 Acute vaginitis: Secondary | ICD-10-CM | POA: Diagnosis not present

## 2021-05-03 DIAGNOSIS — B3731 Acute candidiasis of vulva and vagina: Secondary | ICD-10-CM | POA: Diagnosis not present

## 2021-07-27 ENCOUNTER — Encounter: Payer: Self-pay | Admitting: Neurology

## 2021-08-03 DIAGNOSIS — H6123 Impacted cerumen, bilateral: Secondary | ICD-10-CM | POA: Diagnosis not present

## 2021-08-04 DIAGNOSIS — F3342 Major depressive disorder, recurrent, in full remission: Secondary | ICD-10-CM | POA: Diagnosis not present

## 2021-11-08 ENCOUNTER — Ambulatory Visit: Payer: BLUE CROSS/BLUE SHIELD | Admitting: Neurology

## 2021-11-15 ENCOUNTER — Ambulatory Visit: Payer: BLUE CROSS/BLUE SHIELD | Admitting: Neurology

## 2021-11-20 ENCOUNTER — Ambulatory Visit: Payer: BLUE CROSS/BLUE SHIELD | Admitting: Neurology

## 2021-11-21 ENCOUNTER — Ambulatory Visit: Payer: BC Managed Care – PPO | Admitting: Neurology

## 2021-11-21 ENCOUNTER — Encounter: Payer: Self-pay | Admitting: Neurology

## 2021-11-21 DIAGNOSIS — G40309 Generalized idiopathic epilepsy and epileptic syndromes, not intractable, without status epilepticus: Secondary | ICD-10-CM

## 2021-11-21 MED ORDER — LAMOTRIGINE 100 MG PO TABS: ORAL_TABLET | ORAL | 3 refills | Status: DC

## 2021-11-21 NOTE — Progress Notes (Signed)
NEUROLOGY CONSULTATION NOTE  Wendy Livingston MRN: [id number] DOB: [birth year]  Referring provider: Rockwell Germany, NP Primary care provider: Dr. London Pepper  Reason for consult:  establish adult epilepsy care   Thank you for your kind referral of Wendy Livingston for consultation of the above symptoms. Although her history is well known to you, please allow me to reiterate it for the purpose of our medical record. She is alone in the office today. Records and images were personally reviewed where available.   HISTORY OF PRESENT ILLNESS: This is a pleasant 36 year old right-handed woman with a history of seizures since age 29 presenting to establish adult epilepsy care. Records from her pediatric neurologist were reviewed. She recalls the first seizure occurred in the 8th grade as she was walking to class. She felt very tired that morning, then woke up to EMS around her. The next seizure occurred a month or two later, she again recalls being tired that morning. Her mother describes that she would let out a scream then fall with a convulsion. She denies any staring/unresponsive episodes, gaps in time, olfactory/gustatory hallucinations, focal numbness/tingling/weakness, myoclonic jerks. She was started on Lamotrigine '100mg'$  BID and went seizure-free for 20 years until March 2023 when she had missed some doses of her medication and had a seizure. She felt like her mind was going, she could not make sense of thoughts. She was trying to talk to her father and could not get her words together. She recalls laying in bed then waking up to EMS around her. She denies any nocturnal seizures. Seizure triggers include missing medication and poor sleep. She drinks alcohol socially and has not noticed it to be a trigger. She denies any significant headaches, dizziness, diplopia, dysarthria/dysphagia, neck/back pain, bowel/bladder dysfunction. She feels a little loopy when she is sleep deprived. She usually  gets 6 hours of sleep and feels drowsy in the first part of the day. She has been told she snores. Mood is pretty good. She works for [employer] in [employer], art. She lives with her fiance and will be getting married on Jul 07, 2022. She is not on contraception. No side effects on Lamotrigine '100mg'$  BID.  Epilepsy Risk Factors:  Her maternal great aunt had seizures in her teens. She had a normal birth and early development.  There is no history of febrile convulsions, CNS infections such as meningitis/encephalitis, significant traumatic brain injury, neurosurgical procedures  Diagnostic Data: EEGs: EEG in 2003 was abnormal showing generalized spike wave discharges in the 3 per second range, consistent with generalized epilepsy. She had a brief 2 1/2 second electrographic seizure during the recording. Intermittent theta slowing was seen emanating from the left posterior temporal region. MRI: none. Head CT in 2003 no acute changes.   PAST MEDICAL HISTORY: Past Medical History:  Diagnosis Date   Seizures ([place])    Vaginal Pap smear, abnormal     PAST SURGICAL HISTORY: Past Surgical History:  Procedure Laterality Date   NO PAST SURGERIES      MEDICATIONS: Current Outpatient Medications on File Prior to Visit  Medication Sig Dispense Refill   lamoTRIgine (LAMICTAL) 100 MG tablet TAKE 1 TABLET BY MOUTH TWICE A DAY 180 tablet 1   No current facility-administered medications on file prior to visit.    ALLERGIES: No Known Allergies  FAMILY HISTORY: Family History  Problem Relation Age of Onset   Diabetes Mother    Seizures Other    Heart attack Maternal Grandmother  Diabetes Maternal Grandfather    Kidney failure Maternal Grandfather    Stroke Paternal Grandfather     SOCIAL HISTORY: Social History   Socioeconomic History   Marital status: Single    Spouse name: Not on file   Number of children: Not on file   Years of education: Not on file   Highest education  level: Not on file  Occupational History   Not on file  Tobacco Use   Smoking status: Never    Passive exposure: Never   Smokeless tobacco: Never  Vaping Use   Vaping Use: Never used  Substance and Sexual Activity   Alcohol use: Yes    Alcohol/week: 0.0 standard drinks of alcohol    Comment: socially   Drug use: No   Sexual activity: Yes  Other Topics Concern   Not on file  Social History Narrative   Works full time at [employer]   Right handed    Social Determinants of Health   Financial Resource Strain: Not on file  Food Insecurity: Not on file  Transportation Needs: Not on file  Physical Activity: Not on file  Stress: Not on file  Social Connections: Not on file  Intimate Partner Violence: Not on file     PHYSICAL EXAM: Vitals:   11/21/21 1249  BP: 133/84  Pulse: 77  SpO2: 100%   General: No acute distress Head:  Normocephalic/atraumatic Skin/Extremities: No rash, no edema Neurological Exam: Mental status: alert and oriented to person, place, and time, no dysarthria or aphasia, Fund of knowledge is appropriate.  Recent and remote memory are intact, 3/3 delayed recall.  Attention and concentration are normal, 5/5 WORLD backwards. Cranial nerves: CN I: not tested CN II: pupils equal, round and reactive to light, visual fields intact CN III, IV, VI:  full range of motion, no nystagmus, no ptosis CN V: facial sensation intact CN VII: upper and lower face symmetric CN VIII: hearing intact to conversation Bulk & Tone: normal, no fasciculations. Motor: 5/5 throughout with no pronator drift. Sensation: intact to light touch, cold, pin, vibration sense.  No extinction to double simultaneous stimulation.  Romberg test negative Deep Tendon Reflexes: brisk +2 throughout, no ankle clonus, negative Hoffman sign Cerebellar: no incoordination on finger to nose testing Gait: narrow-based and steady, able to tandem walk adequately. Tremor: none   IMPRESSION: This  is a pleasant 36 year old right-handed woman with a history of primary generalized epilepsy with infrequent convulsions. EEG in 2003 reported 3 Hz generalized spike and wave discharges. Her last GTC was in March 2023 in the setting of missing medication. Prior to this she was seizure-free for 20 years on Lamotrigine '100mg'$  BID. Continue current dose, refills sent. We discussed issues in women with epilepsy, baseline Lamotrigine level will be checked. Start daily folic acid '1mg'$  tablet. They hope to plan for pregnancy in the future, she is getting married in May 2024.  [place] driving laws were discussed with the patient, and she knows to stop driving after a seizure, until 6 months seizure-free. Follow-up in 6 months, call for any changes.   Thank you for allowing me to participate in the care of this patient. Please do not hesitate to call for any questions or concerns.   Ellouise Newer, M.D.  CC: Rockwell Germany, NP, Dr. Orland Mustard

## 2021-11-21 NOTE — Patient Instructions (Signed)
It was nice to meet you.  Continue Lamotrigine '100mg'$  twice a day  2. Have bloodwork done for Lamictal level  3. Start a daily folic acid '1mg'$  tablet  4. Follow-up in 6 months, call for any changes   Seizure Precautions: 1. If medication has been prescribed for you to prevent seizures, take it exactly as directed.  Do not stop taking the medicine without talking to your doctor first, even if you have not had a seizure in a long time.   2. Avoid activities in which a seizure would cause danger to yourself or to others.  Don't operate dangerous machinery, swim alone, or climb in high or dangerous places, such as on ladders, roofs, or girders.  Do not drive unless your doctor says you may.  3. If you have any warning that you may have a seizure, lay down in a safe place where you can't hurt yourself.    4.  No driving for 6 months from last seizure, as per [facility].   Please refer to the following link on the [place] website for more information: http://www.epilepsyfoundation.org/answerplace/Social/driving/drivingu.cfm   5.  Maintain good sleep hygiene. Avoid alcohol.  6.  Notify your neurology if you are planning pregnancy or if you become pregnant.  7.  Contact your doctor if you have any problems that may be related to the medicine you are taking.  8.  Call 911 and bring the patient back to the ED if:        A.  The seizure lasts longer than 5 minutes.       B.  The patient doesn't awaken shortly after the seizure  C.  The patient has new problems such as difficulty seeing, speaking or moving  D.  The patient was injured during the seizure  E.  The patient has a temperature over 102 F (39C)  F.  The patient vomited and now is having trouble breathing

## 2021-12-13 DIAGNOSIS — Z01419 Encounter for gynecological examination (general) (routine) without abnormal findings: Secondary | ICD-10-CM | POA: Diagnosis not present

## 2021-12-13 DIAGNOSIS — Z6831 Body mass index (BMI) 31.0-31.9, adult: Secondary | ICD-10-CM | POA: Diagnosis not present

## 2021-12-13 DIAGNOSIS — Z113 Encounter for screening for infections with a predominantly sexual mode of transmission: Secondary | ICD-10-CM | POA: Diagnosis not present

## 2021-12-13 DIAGNOSIS — Z1151 Encounter for screening for human papillomavirus (HPV): Secondary | ICD-10-CM | POA: Diagnosis not present

## 2021-12-13 DIAGNOSIS — Z124 Encounter for screening for malignant neoplasm of cervix: Secondary | ICD-10-CM | POA: Diagnosis not present

## 2021-12-13 DIAGNOSIS — N925 Other specified irregular menstruation: Secondary | ICD-10-CM | POA: Diagnosis not present

## 2022-01-07 DIAGNOSIS — S39012A Strain of muscle, fascia and tendon of lower back, initial encounter: Secondary | ICD-10-CM | POA: Diagnosis not present

## 2022-01-07 DIAGNOSIS — Z3202 Encounter for pregnancy test, result negative: Secondary | ICD-10-CM | POA: Diagnosis not present

## 2022-04-01 DIAGNOSIS — B9689 Other specified bacterial agents as the cause of diseases classified elsewhere: Secondary | ICD-10-CM | POA: Diagnosis not present

## 2022-04-01 DIAGNOSIS — Z789 Other specified health status: Secondary | ICD-10-CM | POA: Diagnosis not present

## 2022-04-01 DIAGNOSIS — N898 Other specified noninflammatory disorders of vagina: Secondary | ICD-10-CM | POA: Diagnosis not present

## 2022-04-01 DIAGNOSIS — Z6831 Body mass index (BMI) 31.0-31.9, adult: Secondary | ICD-10-CM | POA: Diagnosis not present

## 2022-04-01 DIAGNOSIS — N760 Acute vaginitis: Secondary | ICD-10-CM | POA: Diagnosis not present

## 2022-04-18 DIAGNOSIS — Z6833 Body mass index (BMI) 33.0-33.9, adult: Secondary | ICD-10-CM | POA: Diagnosis not present

## 2022-04-18 DIAGNOSIS — Z13220 Encounter for screening for lipoid disorders: Secondary | ICD-10-CM | POA: Diagnosis not present

## 2022-04-18 DIAGNOSIS — Z136 Encounter for screening for cardiovascular disorders: Secondary | ICD-10-CM | POA: Diagnosis not present

## 2022-04-18 DIAGNOSIS — R030 Elevated blood-pressure reading, without diagnosis of hypertension: Secondary | ICD-10-CM | POA: Diagnosis not present

## 2022-04-18 DIAGNOSIS — Z713 Dietary counseling and surveillance: Secondary | ICD-10-CM | POA: Diagnosis not present

## 2022-04-27 DIAGNOSIS — F3342 Major depressive disorder, recurrent, in full remission: Secondary | ICD-10-CM | POA: Diagnosis not present

## 2022-04-27 DIAGNOSIS — F451 Undifferentiated somatoform disorder: Secondary | ICD-10-CM | POA: Diagnosis not present

## 2022-06-08 ENCOUNTER — Encounter: Payer: Self-pay | Admitting: Neurology

## 2022-06-08 ENCOUNTER — Ambulatory Visit: Payer: BC Managed Care – PPO | Admitting: Neurology

## 2022-06-08 VITALS — BP 135/74 | HR 67 | Ht 68.0 in | Wt 221.0 lb

## 2022-06-08 DIAGNOSIS — R519 Headache, unspecified: Secondary | ICD-10-CM

## 2022-06-08 DIAGNOSIS — G40309 Generalized idiopathic epilepsy and epileptic syndromes, not intractable, without status epilepticus: Secondary | ICD-10-CM | POA: Diagnosis not present

## 2022-06-08 MED ORDER — LAMOTRIGINE 100 MG PO TABS: ORAL_TABLET | ORAL | 3 refills | Status: DC

## 2022-06-08 NOTE — Progress Notes (Signed)
NEUROLOGY FOLLOW UP OFFICE NOTE  Wendy Livingston [id number] 07/16/1985  HISTORY OF PRESENT ILLNESS: I had Wendy pleasure of seeing Wendy Livingston in follow-up in Wendy neurology clinic on 06/08/2022.  Wendy Livingston was last seen 6 months ago for primary generalized epilepsy. She is alone in Wendy office today. Records and images were personally reviewed where available.  Since her last visit, she denies any seizures since March 2023 when she had missed some doses of medication. She continues on Lamotrigine  BID without side effects. No staring/unresponsive episodes, gaps in time, olfactory/gustatory hallucinations, focal numbness/tingling/weakness, myoclonic jerks. For Wendy past month, she has been having frontal headaches around twice a week. Her eyes get heavy. She usually tries to rest, but if it does not improve, she takes Ibuprofen with good effect. No nausea/vomiting. No dizziness. She has bad vision and wears glasses. She is on Wendy computer a lot for work. She gets 5 hours of sleep. Mood is normal. They moved their wedding to May 2025, no current pregnancy plans.    History on Initial Assessment 11/21/2021: This is a pleasant 37 year old right-handed woman with a history of seizures since age 5 presenting to establish adult epilepsy care. Records from her pediatric neurologist were reviewed. She recalls Wendy first seizure occurred in Wendy 8th grade as she was walking to class. She felt very tired that morning, then woke up to EMS around her. Wendy next seizure occurred a month or two later, she again recalls being tired that morning. Her mother describes that she would let out a scream then fall with a convulsion. She denies any staring/unresponsive episodes, gaps in time, olfactory/gustatory hallucinations, focal numbness/tingling/weakness, myoclonic jerks. She was started on Lamotrigine  BID and went seizure-free for 20 years until March 2023 when she had missed some doses of her medication and  had a seizure. She felt like her mind was going, she could not make sense of thoughts. She was trying to talk to her father and could not get her words together. She recalls laying in bed then waking up to EMS around her. She denies any nocturnal seizures. Seizure triggers include missing medication and poor sleep. She drinks alcohol socially and has not noticed it to be a trigger. She denies any significant headaches, dizziness, diplopia, dysarthria/dysphagia, neck/back pain, bowel/bladder dysfunction. She feels a little loopy when she is sleep deprived. She usually gets 6 hours of sleep and feels drowsy in Wendy first part of Wendy day. She has been told she snores. Mood is pretty good. 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 She lives with her fiance and will be getting married on Jul 07, 2022. She is not on contraception. No side effects on Lamotrigine  BID.  Epilepsy Risk Factors:  Her maternal great aunt had seizures in her teens. She had a normal birth and early development.  There is no history of febrile convulsions, CNS infections such as meningitis/encephalitis, significant traumatic brain injury, neurosurgical procedures  Diagnostic Data: EEGs: EEG in 2003 was abnormal showing generalized spike wave discharges in Wendy 3 per second range, consistent with generalized epilepsy. She had a brief 2 1/2 second electrographic seizure during Wendy recording. Intermittent theta slowing was seen emanating from Wendy left posterior temporal region. MRI: none. Head CT in 2003 no acute changes.  PAST MEDICAL HISTORY: Past Medical History:  Diagnosis Date   Seizures    Vaginal Pap smear, abnormal     MEDICATIONS: Current Outpatient Medications on File Prior to  Visit  Medication Sig Dispense Refill   lamoTRIgine (LAMICTAL) 100 MG tablet TAKE 1 TABLET BY MOUTH TWICE A DAY 180 tablet 3   No current facility-administered medications on file prior to visit.    ALLERGIES: No Known  Allergies  FAMILY HISTORY: Family History  Problem Relation Age of Onset   Diabetes Mother    Seizures Other    Heart attack Maternal Grandmother    Diabetes Maternal Grandfather    Kidney failure Maternal Grandfather    Stroke Paternal Grandfather     SOCIAL HISTORY: Social History   Socioeconomic History   Marital status: Single    Spouse name: Not on file   Number of children: Not on file   Years of education: Not on file   Highest education level: Not on file  Occupational History   Not on file  Tobacco Use   Smoking status: Never    Passive exposure: Never   Smokeless tobacco: Never  Vaping Use   Vaping Use: Never used  Substance and Sexual Activity   Alcohol use: Yes    Alcohol/week: 0.0 standard drinks of alcohol    Comment: socially   Drug use: No   Sexual activity: Yes  Other Topics Concern   Not on file  Social History Narrative   Works full time at [employer] [place]   Right handed    Social Determinants of Health   Financial Resource Strain: Not on file  Food Insecurity: Not on file  Transportation Needs: Not on file  Physical Activity: Not on file  Stress: Not on file  Social Connections: Not on file  Intimate Partner Violence: Not on file     PHYSICAL EXAM: Vitals:   06/08/22 1410  BP: 135/74  Pulse: 67  SpO2: 99%   General: No acute distress Head:  Normocephalic/atraumatic Skin/Extremities: No rash, no edema Neurological Exam: alert and awake. No aphasia or dysarthria. Fund of knowledge is appropriate.  Recent and remote memory are intact.  Attention and concentration are normal.   Cranial nerves: Pupils equal, round. Extraocular movements intact with no nystagmus. Visual fields full.  No facial asymmetry.  Motor: Bulk and tone normal, muscle strength 5/5 throughout with no pronator drift. Reflexes brisk +2 throughout, negative Hoffman sign.  Finger to nose testing intact.  Gait narrow-based and steady, able to tandem walk adequately.   Romberg negative.   IMPRESSION: This is a pleasant 37 yo RH woman with a history of primary generalized epilepsy with infrequent convulsions. EEG in 2003 reported 3 Hz generalized spike and wave discharges. No further GTCs since March 2023 (due to missed medication). Continue Lamotrigine  BID, refills sent. We discussed issues in women with epilepsy, no pregnancy plans, start daily folic acid. Check baseline Lamotrigine level. We discussed Wendy headaches and different causes of headaches and red flags to monitor for. She is aware of  driving laws to stop driving after a seizure until 6 months seizure-free. Follow-up in 6 months, call for any changes.   Thank you for allowing me to participate in her care.  Please do not hesitate to call for any questions or concerns.   Patrcia Dolly, M.D.   CC: Dr. Kateri Plummer

## 2022-06-08 NOTE — Patient Instructions (Addendum)
Good to see you.  Have Lamictal level done when able (have bloodwork done before your morning dose)  2. Continue Lamictal (Lamotrigine)  twice a day  3. Start taking daily folic acid   4. Continue to monitor headaches and see if you can identify any triggers  5. Follow-up in 6 months, call for any changes   Seizure Precautions: 1. If medication has been prescribed for you to prevent seizures, take it exactly as directed.  Do not stop taking the medicine without talking to your doctor first, even if you have not had a seizure in a long time.   2. Avoid activities in which a seizure would cause danger to yourself or to others.  Don't operate dangerous machinery, swim alone, or climb in high or dangerous places, such as on ladders, roofs, or girders.  Do not drive unless your doctor says you may.  3. If you have any warning that you may have a seizure, lay down in a safe place where you can't hurt yourself.    4.  No driving for 6 months from last seizure, as per [facility].   Please refer to the following link on the Epilepsy Foundation of America's website for more information: http://www.epilepsyfoundation.org/answerplace/Social/driving/drivingu.cfm   5.  Maintain good sleep hygiene. Avoid alcohol.  6.  Notify your neurology if you are planning pregnancy or if you become pregnant.  7.  Contact your doctor if you have any problems that may be related to the medicine you are taking.  8.  Call 911 and bring the patient back to the ED if:        A.  The seizure lasts longer than 5 minutes.       B.  The patient doesn't awaken shortly after the seizure  C.  The patient has new problems such as difficulty seeing, speaking or moving  D.  The patient was injured during the seizure  E.  The patient has a temperature over 102 F (39C)  F.  The patient vomited and now is having trouble breathing

## 2022-06-15 DIAGNOSIS — R519 Headache, unspecified: Secondary | ICD-10-CM | POA: Diagnosis not present

## 2022-06-15 DIAGNOSIS — J301 Allergic rhinitis due to pollen: Secondary | ICD-10-CM | POA: Diagnosis not present

## 2022-06-15 DIAGNOSIS — R0981 Nasal congestion: Secondary | ICD-10-CM | POA: Diagnosis not present

## 2022-07-09 DIAGNOSIS — Z6841 Body Mass Index (BMI) 40.0 and over, adult: Secondary | ICD-10-CM | POA: Diagnosis not present

## 2022-07-09 DIAGNOSIS — H6123 Impacted cerumen, bilateral: Secondary | ICD-10-CM | POA: Diagnosis not present

## 2022-10-24 DIAGNOSIS — F3342 Major depressive disorder, recurrent, in full remission: Secondary | ICD-10-CM | POA: Diagnosis not present

## 2022-10-24 DIAGNOSIS — F451 Undifferentiated somatoform disorder: Secondary | ICD-10-CM | POA: Diagnosis not present

## 2022-11-26 ENCOUNTER — Ambulatory Visit: Payer: BC Managed Care – PPO | Admitting: Neurology

## 2022-11-26 DIAGNOSIS — H43813 Vitreous degeneration, bilateral: Secondary | ICD-10-CM | POA: Diagnosis not present

## 2022-11-26 DIAGNOSIS — H35413 Lattice degeneration of retina, bilateral: Secondary | ICD-10-CM | POA: Diagnosis not present

## 2022-12-24 DIAGNOSIS — Z6831 Body mass index (BMI) 31.0-31.9, adult: Secondary | ICD-10-CM | POA: Diagnosis not present

## 2022-12-24 DIAGNOSIS — B354 Tinea corporis: Secondary | ICD-10-CM | POA: Diagnosis not present

## 2023-04-16 DIAGNOSIS — Z136 Encounter for screening for cardiovascular disorders: Secondary | ICD-10-CM | POA: Diagnosis not present

## 2023-04-16 DIAGNOSIS — Z713 Dietary counseling and surveillance: Secondary | ICD-10-CM | POA: Diagnosis not present

## 2023-04-16 DIAGNOSIS — Z13220 Encounter for screening for lipoid disorders: Secondary | ICD-10-CM | POA: Diagnosis not present

## 2023-04-16 DIAGNOSIS — Z6832 Body mass index (BMI) 32.0-32.9, adult: Secondary | ICD-10-CM | POA: Diagnosis not present

## 2023-04-19 DIAGNOSIS — F451 Undifferentiated somatoform disorder: Secondary | ICD-10-CM | POA: Diagnosis not present

## 2023-04-19 DIAGNOSIS — F3342 Major depressive disorder, recurrent, in full remission: Secondary | ICD-10-CM | POA: Diagnosis not present

## 2023-04-19 DIAGNOSIS — Z1159 Encounter for screening for other viral diseases: Secondary | ICD-10-CM | POA: Diagnosis not present

## 2023-04-19 DIAGNOSIS — Z0000 Encounter for general adult medical examination without abnormal findings: Secondary | ICD-10-CM | POA: Diagnosis not present

## 2023-04-19 DIAGNOSIS — Z13220 Encounter for screening for lipoid disorders: Secondary | ICD-10-CM | POA: Diagnosis not present

## 2023-05-03 DIAGNOSIS — G40909 Epilepsy, unspecified, not intractable, without status epilepticus: Secondary | ICD-10-CM | POA: Diagnosis not present

## 2023-09-01 ENCOUNTER — Other Ambulatory Visit: Payer: Self-pay | Admitting: Neurology

## 2023-09-01 DIAGNOSIS — G40309 Generalized idiopathic epilepsy and epileptic syndromes, not intractable, without status epilepticus: Secondary | ICD-10-CM

## 2023-10-11 DIAGNOSIS — K5900 Constipation, unspecified: Secondary | ICD-10-CM | POA: Diagnosis not present

## 2023-10-11 DIAGNOSIS — R109 Unspecified abdominal pain: Secondary | ICD-10-CM | POA: Diagnosis not present

## 2023-10-11 DIAGNOSIS — K219 Gastro-esophageal reflux disease without esophagitis: Secondary | ICD-10-CM | POA: Diagnosis not present

## 2023-10-31 DIAGNOSIS — R109 Unspecified abdominal pain: Secondary | ICD-10-CM | POA: Diagnosis not present

## 2023-11-19 DIAGNOSIS — K5909 Other constipation: Secondary | ICD-10-CM | POA: Diagnosis not present

## 2023-12-20 DIAGNOSIS — J018 Other acute sinusitis: Secondary | ICD-10-CM | POA: Diagnosis not present

## 2023-12-20 DIAGNOSIS — B3731 Acute candidiasis of vulva and vagina: Secondary | ICD-10-CM | POA: Diagnosis not present

## 2023-12-20 DIAGNOSIS — Z6829 Body mass index (BMI) 29.0-29.9, adult: Secondary | ICD-10-CM | POA: Diagnosis not present

## 2024-01-19 ENCOUNTER — Emergency Department (HOSPITAL_COMMUNITY)
Admission: EM | Admit: 2024-01-19 | Discharge: 2024-01-20 | Disposition: A | Attending: Emergency Medicine | Admitting: Emergency Medicine

## 2024-01-19 ENCOUNTER — Encounter (HOSPITAL_COMMUNITY): Payer: Self-pay

## 2024-01-19 ENCOUNTER — Other Ambulatory Visit: Payer: Self-pay

## 2024-01-19 DIAGNOSIS — R002 Palpitations: Secondary | ICD-10-CM | POA: Diagnosis present

## 2024-01-19 NOTE — ED Provider Notes (Signed)
 [place] EMERGENCY DEPARTMENT AT [facility] Provider Note   CSN: [id number] Arrival date & time: 01/19/24  2248     History Chief Complaint  Patient presents with   Palpitations    HPI Wendy Livingston is a 38 y.o. female presenting for chief complaint of chest discomfort/palpitations. States that she had multiple episodes today lasting 3-5 minutes.  Currently resolved. Denies pain.  Patient's recorded medical, surgical, social, medication list and allergies were reviewed in the Snapshot window as part of the initial history.   Review of Systems   Review of Systems  Constitutional:  Negative for chills and fever.  HENT:  Negative for ear pain and sore throat.   Eyes:  Negative for pain and visual disturbance.  Respiratory:  Negative for cough and shortness of breath.   Cardiovascular:  Positive for palpitations. Negative for chest pain.  Gastrointestinal:  Negative for abdominal pain and vomiting.  Genitourinary:  Negative for dysuria and hematuria.  Musculoskeletal:  Negative for arthralgias and back pain.  Skin:  Negative for color change and rash.  Neurological:  Negative for seizures and syncope.  All other systems reviewed and are negative.   Physical Exam Updated Vital Signs BP 122/76   Pulse 66   Temp 99.1 F (37.3 C) (Oral)   Resp 20   SpO2 100%  Physical Exam Constitutional:      General: She is not in acute distress.    Appearance: She is not ill-appearing or toxic-appearing.  HENT:     Head: Normocephalic and atraumatic.  Eyes:     Extraocular Movements: Extraocular movements intact.     Pupils: Pupils are equal, round, and reactive to light.  Cardiovascular:     Rate and Rhythm: Normal rate.  Pulmonary:     Effort: No respiratory distress.  Abdominal:     General: Abdomen is flat.  Musculoskeletal:        General: No swelling, deformity or signs of injury.     Cervical back: Normal range of motion. No rigidity.  Skin:     General: Skin is warm and dry.  Neurological:     General: No focal deficit present.     Mental Status: She is alert and oriented to person, place, and time.  Psychiatric:        Mood and Affect: Mood normal.      ED Course/ Medical Decision Making/ A&P    Procedures Procedures   Medications Ordered in ED Medications - No data to display Medical Decision Making: Wendy Livingston is a 38 y.o. female who presented to the ED today with chest pain, detailed above.  Based on patient's comorbidities, patient has a heart score of 1.    Patient placed on continuous vitals and telemetry monitoring while in ED which was reviewed periodically.  Complete initial physical exam performed, notably the patient was HDS in NAD.   Reviewed and confirmed nursing documentation for past medical history, family history, social history.    Initial Assessment: With the patient's presentation of left-sided chest pain, most likely diagnosis is musculoskeletal chest pain versus GERD, although ACS remains on the differential. Other diagnoses were considered including (but not limited to) pulmonary embolism, community-acquired pneumonia, aortic dissection, pneumothorax, underlying bony abnormality, anemia. These are considered less likely due to history of present illness and physical exam findings.    In particular, concerning pulmonary embolism: Patient is PERC negative and the they deny malignancy, recent surgery, history of DVT, or calf tenderness leading  to a low risk Wells score. Aortic Dissection also reconsidered but seems less likely based on the location, quality, onset, and severity of symptoms in this case. Patient has a lack of serious comorbidities for this condition including a lack of HTN or Smoking. Patient also has a lack of underlying history of AD or TAA.  This is most consistent with an acute life/limb threatening illness complicated by underlying chronic conditions.   Initial Plan: EKG and  single troponin to evaluate for cardiac pathology. Single troponin appropriate due to greater than 6 hours since maximal intensity of symptoms. Further evaluation for pulmonary embolism not indicated at this time based on patient's PERC and Wells score.  Further evaluation for Thoracic Aortic Dissection not indicated at this time based on patient's clinical history and PE findings.   Initial Study Results: EKG was reviewed independently. Rate, rhythm, axis, intervals all examined and without medically relevant abnormality. ST segments without concerns for elevations.    Laboratory  Single troponin demonstrated NAA   CBC and BMP without obvious metabolic or inflammatory abnormalities requiring further evaluation   Radiology  No results found.  Final Assessment and Plan: Patient's history of present illness and physical exam findings are most consistent with nonspecific palpitations. No acute pathology detected today.  Telemetry reviewed with occasional PVCs/early atrial complexes.  Will refer to cardiology for serial monitoring ongoing care and management outpatient setting and discussed supportive care in the interim.  Patient in agreement asymptomatic at time of reassessment.           Clinical Impression:  1. Heart palpitations      Discharge   Final Clinical Impression(s) / ED Diagnoses Final diagnoses:  Heart palpitations    Rx / DC Orders ED Discharge Orders     None         Jerral Meth, MD 01/20/24 [phone]

## 2024-01-19 NOTE — ED Triage Notes (Signed)
 Pt reports with palpitations since 6 pm today when she woke up.

## 2024-01-20 LAB — BASIC METABOLIC PANEL WITH GFR
Anion gap: 9 (ref 5–15)
BUN: 10 mg/dL (ref 6–20)
CO2: 24 mmol/L (ref 22–32)
Calcium: 9.2 mg/dL (ref 8.9–10.3)
Chloride: 104 mmol/L (ref 98–111)
Creatinine, Ser: 1.03 mg/dL — ABNORMAL HIGH (ref 0.44–1.00)
GFR, Estimated: >60 mL/min (ref >60)
Glucose, Bld: 125 mg/dL — ABNORMAL HIGH (ref 70–99)
Potassium: 4.3 mmol/L (ref 3.5–5.1)
Sodium: 137 mmol/L (ref 135–145)

## 2024-01-20 LAB — MAGNESIUM: Magnesium: 2.1 mg/dL (ref 1.7–2.4)

## 2024-01-20 LAB — CBC WITH DIFFERENTIAL/PLATELET
Abs Immature Granulocytes: 0.05 K/uL (ref 0.00–0.07)
Basophils Absolute: 0.1 K/uL (ref 0.0–0.1)
Basophils Relative: 1 %
Eosinophils Absolute: 0.5 K/uL (ref 0.0–0.5)
Eosinophils Relative: 5 %
HCT: 40.6 % (ref 36.0–46.0)
Hemoglobin: 12.9 g/dL (ref 12.0–15.0)
Immature Granulocytes: 1 %
Lymphocytes Relative: 29 %
Lymphs Abs: 2.7 K/uL (ref 0.7–4.0)
MCH: 26 pg (ref 26.0–34.0)
MCHC: 31.8 g/dL (ref 30.0–36.0)
MCV: 81.9 fL (ref 80.0–100.0)
Monocytes Absolute: 1 K/uL (ref 0.1–1.0)
Monocytes Relative: 11 %
Neutro Abs: 4.9 K/uL (ref 1.7–7.7)
Neutrophils Relative %: 53 %
Platelets: 387 K/uL (ref 150–400)
RBC: 4.96 MIL/uL (ref 3.87–5.11)
RDW: 13 % (ref 11.5–15.5)
WBC: 9.2 K/uL (ref 4.0–10.5)
nRBC: 0 % (ref 0.0–0.2)

## 2024-01-20 LAB — TROPONIN T, HIGH SENSITIVITY: Troponin T High Sensitivity: <15 ng/L (ref 0–19)

## 2024-01-30 ENCOUNTER — Encounter: Payer: Self-pay | Admitting: Neurology

## 2024-01-30 ENCOUNTER — Ambulatory Visit: Admitting: Neurology

## 2024-01-30 VITALS — BP 125/83 | HR 90 | Ht 69.0 in | Wt 204.2 lb

## 2024-01-30 DIAGNOSIS — G40309 Generalized idiopathic epilepsy and epileptic syndromes, not intractable, without status epilepticus: Secondary | ICD-10-CM | POA: Diagnosis not present

## 2024-01-30 DIAGNOSIS — G44219 Episodic tension-type headache, not intractable: Secondary | ICD-10-CM | POA: Diagnosis not present

## 2024-01-30 MED ORDER — LAMOTRIGINE 100 MG PO TABS: 100.0000 mg | ORAL_TABLET | Freq: Two times a day (BID) | ORAL | 3 refills | Status: AC

## 2024-01-30 NOTE — Patient Instructions (Signed)
 Good to see you!  Continue Lamotrigine  100mg  twice a day  2. Start a headache diary monitoring for any triggers (period, sleep, stress, certain foods)  3. Follow-up in 6 months, call for any changes   Seizure Precautions: 1. If medication has been prescribed for you to prevent seizures, take it exactly as directed.  Do not stop taking the medicine without talking to your doctor first, even if you have not had a seizure in a long time.   2. Avoid activities in which a seizure would cause danger to yourself or to others.  Don't operate dangerous machinery, swim alone, or climb in high or dangerous places, such as on ladders, roofs, or girders.  Do not drive unless your doctor says you may.  3. If you have any warning that you may have a seizure, lay down in a safe place where you can't hurt yourself.    4.  No driving for 6 months from last seizure, as per [place]  state law.   Please refer to the following link on the [employer]ile[employer]'s website for more information: http://www.epilepsyfoundation.org/answerplace/Social/driving/drivingu.cfm   5.  Maintain good sleep hygiene. Avoid alcohol.  6.  Notify your neurology if you are planning pregnancy or if you become pregnant.  7.  Contact your doctor if you have any problems that may be related to the medicine you are taking.  8.  Call 911 and bring the patient back to the ED if:        A.  The seizure lasts longer than 5 minutes.       B.  The patient doesn't awaken shortly after the seizure  C.  The patient has new problems such as difficulty seeing, speaking or moving  D.  The patient was injured during the seizure  E.  The patient has a temperature over 102 F (39C)  F.  The patient vomited and now is having trouble breathing

## 2024-01-30 NOTE — Progress Notes (Signed)
 NEUROLOGY FOLLOW UP OFFICE NOTE  Wendy Livingston [id number] 1985/12/03  Discussed the use of AI scribe software for clinical note transcription with the patient, who gave verbal consent to proceed.  History of Present Illness I had the pleasure of seeing Wendy Livingston in follow-up in the neurology clinic on 01/30/2024.  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 She is alone in the office today. Records and images were personally reviewed where available.  Since her last visit, she denies any seizures since March 2023 (missed medication). She denies any staring/unresponsive episodes, gaps in time, olfactory/gustatory hallucinations, focal numbness/tingling/weakness, myoclonic jerks. No side effects on Lamotrigine  100mg  BID. She was reporting new onset headaches on last visit, she is still having them, approximately three to four times a month, described as 'shocking' or stabbing in nature, typically lasting about an hour. These headaches are sometimes located on one side of the head (yesterday it was on the left) or behind the right eye. During these episodes, she notes the presence of floaters in her right eye. She had a retinal detachment scare on that eye in the past but it was fine. The headaches are accompanied by photophobia, and there is no associated nausea or vomiting. If severe, she manages the headaches with ibuprofen, which she finds effective. Although she has not identified specific triggers, she acknowledges a possible link to her menstrual cycle, as the last headache coincided with her period. She also notes that getting less sleep may contribute to the frequency of her headaches, as she typically sleeps about six hours a night but feels better with seven to eight hours. She denies any dizziness, neck pain. No falls.  She works full-time at [employer], primarily soil scientist work, which she acknowledges may contribute to her headaches.  She lives alone and consumes alcohol socially. No pregnancy plans.   History on Initial Assessment 11/21/2021: This is a pleasant 38 year old right-handed woman with a history of seizures since age 43 presenting to establish adult epilepsy care. Records from her pediatric neurologist were reviewed. She recalls the first seizure occurred in the 8th grade as she was walking to class. She felt very tired that morning, then woke up to EMS around her. The next seizure occurred a month or two later, she again recalls being tired that morning. Her mother describes that she would let out a scream then fall with a convulsion. She denies any staring/unresponsive episodes, gaps in time, olfactory/gustatory hallucinations, focal numbness/tingling/weakness, myoclonic jerks. She was started on Lamotrigine  100mg  BID and went seizure-free for 20 years until March 2023 when she had missed some doses of her medication and had a seizure. She felt like her mind was going, she could not make sense of thoughts. She was trying to talk to her father and could not get her words together. She recalls laying in bed then waking up to EMS around her. She denies any nocturnal seizures. Seizure triggers include missing medication and poor sleep. She drinks alcohol socially and has not noticed it to be a trigger. She denies any significant headaches, dizziness, diplopia, dysarthria/dysphagia, neck/back pain, bowel/bladder dysfunction. She feels a little loopy when she is sleep deprived. She usually gets 6 hours of sleep and feels drowsy in the first part of the day. She has been told she snores. Mood is pretty good. She works for [employer] in clinical biochemist. She lives with her fiance and will be getting married on  Jul 07, 2022. She is not on contraception. No side effects on Lamotrigine  100mg  BID.  Epilepsy Risk Factors:  Her maternal great aunt had seizures in her teens. She had a normal birth and early development.  There is no  history of febrile convulsions, CNS infections such as meningitis/encephalitis, significant traumatic brain injury, neurosurgical procedures  Diagnostic Data: EEGs: EEG in 2003 was abnormal showing generalized spike wave discharges in the 3 per second range, consistent with generalized epilepsy. She had a brief 2 1/2 second electrographic seizure during the recording. Intermittent theta slowing was seen emanating from the left posterior temporal region. MRI: none. Head CT in 2003 no acute changes.  PAST MEDICAL HISTORY: Past Medical History:  Diagnosis Date   Seizures (HCC)    Vaginal Pap smear, abnormal     MEDICATIONS: Medications Ordered Prior to Encounter[1]  ALLERGIES: Allergies[2]  FAMILY HISTORY: Family History  Problem Relation Age of Onset   Diabetes Mother    Seizures Other    Heart attack Maternal Grandmother    Diabetes Maternal Grandfather    Kidney failure Maternal Grandfather    Stroke Paternal Grandfather     SOCIAL HISTORY: Social History   Socioeconomic History   Marital status: Single    Spouse name: Not on file   Number of children: Not on file   Years of education: Not on file   Highest education level: Not on file  Occupational History   Not on file  Tobacco Use   Smoking status: Never    Passive exposure: Never   Smokeless tobacco: Never  Vaping Use   Vaping status: Never Used  Substance and Sexual Activity   Alcohol use: Yes    Alcohol/week: 0.0 standard drinks of alcohol    Comment: socially   Drug use: No   Sexual activity: Yes  Other Topics Concern   Not on file  Social History Narrative   Works full time at [employer]   Right handed    Social Drivers of Health   Tobacco Use: Low Risk (01/30/2024)   Patient History    Smoking Tobacco Use: Never    Smokeless Tobacco Use: Never    Passive Exposure: Never  Financial Resource Strain: Not on file  Food Insecurity: Not on file  Transportation Needs: Not on file  Physical  Activity: Not on file  Stress: Not on file  Social Connections: Not on file  Intimate Partner Violence: Not on file  Depression (EYV7-0): Not on file  Alcohol Screen: Not on file  Housing: Not on file  Utilities: Not on file  Health Literacy: Not on file     PHYSICAL EXAM: Vitals:   01/30/24 0818  BP: 125/83  Pulse: 90  SpO2: 100%   General: No acute distress Head:  Normocephalic/atraumatic Skin/Extremities: No rash, no edema Neurological Exam: alert and awake. No aphasia or dysarthria. Fund of knowledge is appropriate. Attention and concentration are normal.   Cranial nerves: Pupils equal, round. Extraocular movements intact with no nystagmus. Visual fields full.  No facial asymmetry.  Motor: Bulk and tone normal, muscle strength 5/5 throughout with no pronator drift.   Finger to nose testing intact.  Gait narrow-based and steady, able to tandem walk adequately.  Romberg negative.   IMPRESSION: This is a pleasant 38 yo RH woman with a history of primary generalized epilepsy with infrequent convulsions. EEG in 2003 reported 3 Hz generalized spike and wave discharges. She has been seizure-free since March 2023 (missed medication), continue Lamotrigine  100mg   BID. She has headaches 3-4 times a month, we discussed starting a headache diary and monitoring for any triggers. No pregnancy plans. She is aware of [place] driving laws to stop driving after a seizure until 6 months seizure-free. Follow-up in 6 months, call for any changes.    Thank you for allowing me to participate in her care.  Please do not hesitate to call for any questions or concerns.    Darice Shivers, M.D.   CC: Dr. Kip       [1]  Current Outpatient Medications on File Prior to Visit  Medication Sig Dispense Refill   lamoTRIgine  (LAMICTAL ) 100 MG tablet TAKE 1 TABLET BY MOUTH TWICE A DAY 180 tablet 3   No current facility-administered medications on file prior to visit.  [2] No Known Allergies

## 2024-08-14 ENCOUNTER — Ambulatory Visit: Payer: Self-pay | Admitting: Neurology
